# Patient Record
Sex: Male | Born: 1960 | Hispanic: No | Marital: Single | Smoking: Never smoker
Health system: Southern US, Community
[De-identification: ages and names within clinical notes are randomized; demographics above are authoritative.]

## PROBLEM LIST (undated history)

## (undated) DIAGNOSIS — E7800 Pure hypercholesterolemia, unspecified: Secondary | ICD-10-CM

## (undated) DIAGNOSIS — E785 Hyperlipidemia, unspecified: Secondary | ICD-10-CM

## (undated) DIAGNOSIS — I10 Essential (primary) hypertension: Secondary | ICD-10-CM

## (undated) HISTORY — PX: HERNIA REPAIR: SHX51

## (undated) HISTORY — DX: Essential (primary) hypertension: I10

## (undated) HISTORY — DX: Hyperlipidemia, unspecified: E78.5

---

## 2002-03-07 ENCOUNTER — Encounter: Payer: Self-pay | Admitting: Emergency Medicine

## 2002-03-07 ENCOUNTER — Emergency Department (HOSPITAL_COMMUNITY): Admission: EM | Admit: 2002-03-07 | Discharge: 2002-03-07 | Payer: Self-pay | Admitting: Emergency Medicine

## 2004-09-20 ENCOUNTER — Emergency Department (HOSPITAL_COMMUNITY): Admission: EM | Admit: 2004-09-20 | Discharge: 2004-09-20 | Payer: Self-pay | Admitting: Emergency Medicine

## 2011-05-05 ENCOUNTER — Other Ambulatory Visit: Payer: Self-pay | Admitting: Family Medicine

## 2011-05-05 DIAGNOSIS — I10 Essential (primary) hypertension: Secondary | ICD-10-CM

## 2011-05-05 DIAGNOSIS — E7800 Pure hypercholesterolemia, unspecified: Secondary | ICD-10-CM

## 2014-01-28 ENCOUNTER — Ambulatory Visit (INDEPENDENT_AMBULATORY_CARE_PROVIDER_SITE_OTHER): Payer: BC Managed Care – PPO | Admitting: Family Medicine

## 2014-01-28 VITALS — BP 180/104 | HR 88 | Temp 98.5°F | Resp 16 | Ht 64.5 in | Wt 158.6 lb

## 2014-01-28 DIAGNOSIS — N400 Enlarged prostate without lower urinary tract symptoms: Secondary | ICD-10-CM

## 2014-01-28 DIAGNOSIS — I10 Essential (primary) hypertension: Secondary | ICD-10-CM

## 2014-01-28 DIAGNOSIS — Z13220 Encounter for screening for lipoid disorders: Secondary | ICD-10-CM

## 2014-01-28 DIAGNOSIS — J069 Acute upper respiratory infection, unspecified: Secondary | ICD-10-CM

## 2014-01-28 DIAGNOSIS — Z1211 Encounter for screening for malignant neoplasm of colon: Secondary | ICD-10-CM

## 2014-01-28 DIAGNOSIS — R3911 Hesitancy of micturition: Secondary | ICD-10-CM

## 2014-01-28 DIAGNOSIS — K4090 Unilateral inguinal hernia, without obstruction or gangrene, not specified as recurrent: Secondary | ICD-10-CM

## 2014-01-28 DIAGNOSIS — Z131 Encounter for screening for diabetes mellitus: Secondary | ICD-10-CM

## 2014-01-28 DIAGNOSIS — Z0000 Encounter for general adult medical examination without abnormal findings: Secondary | ICD-10-CM

## 2014-01-28 DIAGNOSIS — Z1329 Encounter for screening for other suspected endocrine disorder: Secondary | ICD-10-CM

## 2014-01-28 DIAGNOSIS — Z130 Encounter for screening for diseases of the blood and blood-forming organs and certain disorders involving the immune mechanism: Secondary | ICD-10-CM

## 2014-01-28 DIAGNOSIS — I517 Cardiomegaly: Secondary | ICD-10-CM

## 2014-01-28 LAB — POCT UA - MICROSCOPIC ONLY
Bacteria, U Microscopic: NEGATIVE
Casts, Ur, LPF, POC: NEGATIVE
Crystals, Ur, HPF, POC: NEGATIVE
Mucus, UA: NEGATIVE
WBC, Ur, HPF, POC: NEGATIVE
Yeast, UA: NEGATIVE

## 2014-01-28 LAB — POCT CBC
Granulocyte percent: 78.8 %G (ref 37–80)
HCT, POC: 41.7 % — AB (ref 43.5–53.7)
Hemoglobin: 13.5 g/dL — AB (ref 14.1–18.1)
Lymph, poc: 2.1 (ref 0.6–3.4)
MCH, POC: 30.7 pg (ref 27–31.2)
MCHC: 32.5 g/dL (ref 31.8–35.4)
MCV: 94.5 fL (ref 80–97)
MID (cbc): 0.3 (ref 0–0.9)
MPV: 8.4 fL (ref 0–99.8)
POC Granulocyte: 9 — AB (ref 2–6.9)
POC LYMPH PERCENT: 18.2 %L (ref 10–50)
POC MID %: 3 %M (ref 0–12)
Platelet Count, POC: 196 10*3/uL (ref 142–424)
RBC: 4.41 M/uL — AB (ref 4.69–6.13)
RDW, POC: 13.3 %
WBC: 11.4 10*3/uL — AB (ref 4.6–10.2)

## 2014-01-28 LAB — POCT URINALYSIS DIPSTICK
Bilirubin, UA: NEGATIVE
Glucose, UA: NEGATIVE
Ketones, UA: NEGATIVE
Leukocytes, UA: NEGATIVE
Nitrite, UA: NEGATIVE
Protein, UA: NEGATIVE
Spec Grav, UA: 1.015
Urobilinogen, UA: 1
pH, UA: 6

## 2014-01-28 LAB — POC HEMOCCULT BLD/STL (OFFICE/1-CARD/DIAGNOSTIC): FECAL OCCULT BLD: NEGATIVE

## 2014-01-28 LAB — POCT GLYCOSYLATED HEMOGLOBIN (HGB A1C): Hemoglobin A1C: 5.1

## 2014-01-28 MED ORDER — TAMSULOSIN HCL 0.4 MG PO CAPS: 0.4000 mg | ORAL_CAPSULE | Freq: Every day | ORAL | Status: DC

## 2014-01-28 MED ORDER — LISINOPRIL-HYDROCHLOROTHIAZIDE 20-25 MG PO TABS: 1.0000 | ORAL_TABLET | Freq: Every day | ORAL | Status: DC

## 2014-01-28 NOTE — Patient Instructions (Signed)
Resume taking the blood pressure medicine one daily  Taking the prostate pill, tamsulosin, 1 at bedtime  Referral is being made to general surgery to evaluate your hernia  Referral will be made to gastroenterology to arrange for a colonoscopy for you which should be done on a screening basis for everyone over 50. You should get one every 10 years unless they find something of more concern.

## 2014-01-28 NOTE — Progress Notes (Signed)
Physical exam:  History: 53 year old gentleman who is here for a physical examination. He has had a recent cold. He has a history of high blood pressure but has not been on medication for a long time. His problem been about 7 or 8 years since he has had a physical examination done.  Past medical history: Medications: None Allergies: None Operations: None Hospitalizations: None Major illnesses: History of hypertension, currently untreated. History of hyperlipidemia.  Family history: Very poorly documented family history. He never knew his father, and it is been about 40 years since he saw his mother. She is now deceased.  Social history: [place], full blooded [place] history. 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 He is single, never married, no children. No longer attends church. Couldn't tolerate the contemporary services. He does not drive but he rides a bicycle everywhere so he gets a lot of exercise both doing his work and riding his bicycle. He does not drink, smoke, or use tobacco. 10th grade education  Review of systems: HEENT: Unremarkable. He did have some glasses but he sat on them and knows he needs them some time. Constitutional: Unremarkable Respiratory: Unremarkable Cardiovascular: Unremarkable Gastrointestinal: Unremarkable Genitourinary: He has urinary hesitancy and nocturia about 4 times. This been bothering him a lot over the last few months. Musculoskeletal: Unremarkable Neurologic: Unremarkable Psychiatric: Unremarkable Dermatologic: Unremarkable  Physical exam: Healthy-appearing gentleman in no acute distress. His TMs are normal. Eyes PERRLA. Fundi benign. Throat clear. Neck supple without significant nodes. No carotid bruits. Chest is clear to auscultation. Heart regular without murmurs gallops or arrhythmias. Abdomen soft without mass or tenderness. Has a moderate size left inguinal hernia which he did not know he had. No testicular  masses. Penis normal. Digital rectal exam reveals prostate gland to be a little on the firm side, upper normal in size, no nodules. Extremities unremarkable. Skin normal.  Assessment: Physical examination Hypertension, unsatisfactory control Left inguinal hernia Upper respiratory infection, stable Urinary hesitancy Nocturia Probable benign prostatic hypertrophy  Plan: Urinalysis, CBC, hemoglobin A1c, Hemoccult, cmet, TSH, lipids, PSA, EKG  Results for orders placed in visit on 01/28/14  POCT CBC      Result Value Ref Range   WBC 11.4 (*) 4.6 - 10.2 K/uL   Lymph, poc 2.1  0.6 - 3.4   POC LYMPH PERCENT 18.2  10 - 50 %L   MID (cbc) 0.3  0 - 0.9   POC MID % 3.0  0 - 12 %M   POC Granulocyte 9.0 (*) 2 - 6.9   Granulocyte percent 78.8  37 - 80 %G   RBC 4.41 (*) 4.69 - 6.13 M/uL   Hemoglobin 13.5 (*) 14.1 - 18.1 g/dL   HCT, POC 40.941.7 (*) 81.143.5 - 53.7 %   MCV 94.5  80 - 97 fL   MCH, POC 30.7  27 - 31.2 pg   MCHC 32.5  31.8 - 35.4 g/dL   RDW, POC 91.413.3     Platelet Count, POC 196  142 - 424 K/uL   MPV 8.4  0 - 99.8 fL  POCT GLYCOSYLATED HEMOGLOBIN (HGB A1C)      Result Value Ref Range   Hemoglobin A1C 5.1    POCT UA - MICROSCOPIC ONLY      Result Value Ref Range   WBC, Ur, HPF, POC neg     RBC, urine, microscopic 4-8     Bacteria, U Microscopic neg     Mucus, UA neg  Epithelial cells, urine per micros 0-1     Crystals, Ur, HPF, POC neg     Casts, Ur, LPF, POC neg     Yeast, UA neg    POCT URINALYSIS DIPSTICK      Result Value Ref Range   Color, UA yellow     Clarity, UA clear     Glucose, UA neg     Bilirubin, UA neg     Ketones, UA neg     Spec Grav, UA 1.015     Blood, UA moderate     pH, UA 6.0     Protein, UA neg     Urobilinogen, UA 1.0     Nitrite, UA neg     Leukocytes, UA Negative    POC HEMOCCULT BLD/STL (OFFICE/1-CARD/DIAGNOSTIC)      Result Value Ref Range   Card #1 Date 01-28-14     Fecal Occult Blood, POC Negative

## 2014-01-29 LAB — LIPID PANEL
Cholesterol: 180 mg/dL (ref 0–200)
HDL: 35 mg/dL — ABNORMAL LOW (ref >39)
LDL Cholesterol: 114 mg/dL — ABNORMAL HIGH (ref 0–99)
Total CHOL/HDL Ratio: 5.1 Ratio
Triglycerides: 157 mg/dL — ABNORMAL HIGH (ref <150)
VLDL: 31 mg/dL (ref 0–40)

## 2014-01-29 LAB — COMPREHENSIVE METABOLIC PANEL
ALT: 18 U/L (ref 0–53)
AST: 21 U/L (ref 0–37)
Albumin: 3.9 g/dL (ref 3.5–5.2)
Alkaline Phosphatase: 41 U/L (ref 39–117)
BUN: 13 mg/dL (ref 6–23)
CO2: 24 mEq/L (ref 19–32)
Calcium: 8.8 mg/dL (ref 8.4–10.5)
Chloride: 103 mEq/L (ref 96–112)
Creat: 0.89 mg/dL (ref 0.50–1.35)
Glucose, Bld: 87 mg/dL (ref 70–99)
Potassium: 4.1 mEq/L (ref 3.5–5.3)
Sodium: 136 mEq/L (ref 135–145)
Total Bilirubin: 0.4 mg/dL (ref 0.2–1.2)
Total Protein: 7.2 g/dL (ref 6.0–8.3)

## 2014-01-29 LAB — TSH: TSH: 1.447 u[IU]/mL (ref 0.350–4.500)

## 2014-01-30 LAB — PSA: PSA: 1.15 ng/mL (ref <=4.00)

## 2014-01-31 ENCOUNTER — Encounter: Payer: Self-pay | Admitting: Radiology

## 2014-02-01 ENCOUNTER — Encounter: Payer: Self-pay | Admitting: Internal Medicine

## 2014-04-03 ENCOUNTER — Telehealth: Payer: Self-pay | Admitting: *Deleted

## 2014-04-03 NOTE — Telephone Encounter (Signed)
Patient no show for previsit appointment 04/03/14. Left message to return call to reschedule previsit appointment before 5 pm today or colonoscopy scheduled 04/10/14 would be cancelled as well and both appointments would have to be rescheduled.

## 2014-04-03 NOTE — Telephone Encounter (Signed)
Multiple attempts to call patient throughout the day. Last attempt reached a gentleman who states he is not Mr. Ogan and does not know who that person is. He states he has had the phone number called approximately 1 month. Appointments cancelled and no show letter mailed to address on file.

## 2014-04-10 ENCOUNTER — Encounter: Payer: Self-pay | Admitting: Internal Medicine

## 2014-04-15 ENCOUNTER — Emergency Department (HOSPITAL_COMMUNITY)
Admission: EM | Admit: 2014-04-15 | Discharge: 2014-04-15 | Disposition: A | Discharge status: Home / Self Care | Payer: BC Managed Care – PPO | Attending: Emergency Medicine | Admitting: Emergency Medicine

## 2014-04-15 ENCOUNTER — Encounter (HOSPITAL_COMMUNITY): Payer: Self-pay | Admitting: Cardiology

## 2014-04-15 DIAGNOSIS — N3000 Acute cystitis without hematuria: Secondary | ICD-10-CM | POA: Insufficient documentation

## 2014-04-15 DIAGNOSIS — K4090 Unilateral inguinal hernia, without obstruction or gangrene, not specified as recurrent: Secondary | ICD-10-CM | POA: Diagnosis present

## 2014-04-15 DIAGNOSIS — E785 Hyperlipidemia, unspecified: Secondary | ICD-10-CM | POA: Insufficient documentation

## 2014-04-15 DIAGNOSIS — Z79899 Other long term (current) drug therapy: Secondary | ICD-10-CM | POA: Insufficient documentation

## 2014-04-15 DIAGNOSIS — I10 Essential (primary) hypertension: Secondary | ICD-10-CM | POA: Diagnosis not present

## 2014-04-15 LAB — URINALYSIS, ROUTINE W REFLEX MICROSCOPIC
Bilirubin Urine: NEGATIVE
Glucose, UA: NEGATIVE mg/dL
Hgb urine dipstick: NEGATIVE
Ketones, ur: NEGATIVE mg/dL
Nitrite: POSITIVE — AB
Protein, ur: NEGATIVE mg/dL
Specific Gravity, Urine: 1.01 (ref 1.005–1.030)
Urobilinogen, UA: 0.2 mg/dL (ref 0.0–1.0)
pH: 7.5 (ref 5.0–8.0)

## 2014-04-15 LAB — URINE MICROSCOPIC-ADD ON

## 2014-04-15 MED ORDER — NITROFURANTOIN MONOHYD MACRO 100 MG PO CAPS: 100.0000 mg | ORAL_CAPSULE | Freq: Two times a day (BID) | ORAL | Status: DC

## 2014-04-15 NOTE — Discharge Instructions (Signed)
Return here as needed.  Call the surgeon for an appointment.  Return here as needed

## 2014-04-15 NOTE — ED Notes (Signed)
Pt left prior to receiving dc paperwork

## 2014-04-15 NOTE — ED Provider Notes (Addendum)
CSN: [id number]     Arrival date & time 04/15/14  1007 History   First MD Initiated Contact with Patient 04/15/14 1110     Chief Complaint  Patient presents with  . Hernia     (Consider location/radiation/quality/duration/timing/severity/associated sxs/prior Treatment) HPI Patient presents to the emergency department with a left inguinal hernia.  The patient states that he was looking to have this assessed and possibly have surgery here today for this hernia.  The patient is not complaining of any pain and states that he does have surgery scheduled for sometime in March.  The patient denies chest pain, shortness of breath, weakness, dizziness, fever, abdominal pain, diarrhea, nausea, vomiting, or fever.  The patient states that he originally had since surgery scheduled for January 5. Past Medical History  Diagnosis Date  . Hyperlipidemia   . Hypertension    History reviewed. No pertinent past surgical history. History reviewed. No pertinent family history. History  Substance Use Topics  . Smoking status: Never Smoker   . Smokeless tobacco: Never Used  . Alcohol Use: No    Review of Systems  All other systems negative except as documented in the HPI. All pertinent positives and negatives as reviewed in the HPI.   Allergies  Review of patient's allergies indicates no known allergies.  Home Medications   Prior to Admission medications   Medication Sig Start Date End Date Taking? Authorizing Provider  lisinopril-hydrochlorothiazide (PRINZIDE,ZESTORETIC) 20-25 MG per tablet Take 1 tablet by mouth daily. 01/28/14  Yes Peyton Najjaravid H Hopper, MD  psyllium (REGULOID) 0.52 G capsule Take 1.04 g by mouth daily.   Yes Historical Provider, MD  pravastatin (PRAVACHOL) 40 MG tablet TAKE 1 TABLET EVERY DAY Patient not taking: Reported on 04/15/2014 05/05/11   Raymon Muttonyan M Dunn, PA-C  tamsulosin (FLOMAX) 0.4 MG CAPS capsule Take 1 capsule (0.4 mg total) by mouth daily. Patient not taking: Reported on  04/15/2014 01/28/14   Peyton Najjaravid H Hopper, MD   BP 105/73 mmHg  Pulse 91  Temp(Src) 97.9 F (36.6 C) (Oral)  Resp 18  SpO2 96% Physical Exam  Constitutional: He is oriented to person, place, and time. He appears well-developed and well-nourished. No distress.  HENT:  Head: Normocephalic and atraumatic.  Mouth/Throat: Oropharynx is clear and moist.  Eyes: Pupils are equal, round, and reactive to light.  Neck: Normal range of motion. Neck supple.  Cardiovascular: Normal rate, regular rhythm and normal heart sounds.  Exam reveals no gallop and no friction rub.   No murmur heard. Pulmonary/Chest: Effort normal and breath sounds normal. No respiratory distress.  Abdominal: Normal appearance. There is no tenderness. A hernia is present. Hernia confirmed positive in the left inguinal area.    Neurological: He is alert and oriented to person, place, and time. He exhibits normal muscle tone. Coordination normal.  Skin: Skin is warm and dry. No rash noted. No erythema.  Nursing note and vitals reviewed.   ED Course  Procedures (including critical care time)  MDM   I spoke with the general surgeon about the patient and he said that he can follow-up in their clinic as needed for a recheck, but the patient does have surgery scheduled for March the main issue has been procuring enough funds to pay for the upfront cost of surgery.  Patient does have some mild discomfort with urination      Carlyle DollyChristopher W Nayali Talerico, PA-C 04/15/14 1439  Nelia Shiobert L Beaton, MD 04/15/14 [phone]

## 2014-04-15 NOTE — ED Notes (Signed)
Pt reports that he thinks that he has a left inguinal hernia. Reports that he went to [facility] and needed surgery but did not have he money at the time. Pt reports that he has had some difficulty urinating.

## 2014-04-15 NOTE — ED Notes (Signed)
Pt walked out of ED room

## 2014-04-17 LAB — URINE CULTURE: Colony Count: 15000

## 2014-05-29 ENCOUNTER — Other Ambulatory Visit: Payer: Self-pay | Admitting: Family Medicine

## 2014-08-01 ENCOUNTER — Other Ambulatory Visit: Payer: Self-pay | Admitting: Physician Assistant

## 2014-09-29 ENCOUNTER — Ambulatory Visit (INDEPENDENT_AMBULATORY_CARE_PROVIDER_SITE_OTHER): Payer: BC Managed Care – PPO | Admitting: Family Medicine

## 2014-09-29 VITALS — BP 108/70 | HR 105 | Temp 98.3°F | Resp 20 | Ht 65.0 in | Wt 157.0 lb

## 2014-09-29 DIAGNOSIS — I10 Essential (primary) hypertension: Secondary | ICD-10-CM | POA: Diagnosis not present

## 2014-09-29 MED ORDER — LISINOPRIL-HYDROCHLOROTHIAZIDE 20-25 MG PO TABS: ORAL_TABLET | ORAL | Status: DC

## 2014-09-29 NOTE — Progress Notes (Signed)
  Subjective:  Patient ID: Gregory Booker, male    DOB: Sep 15, 1960  Age: 54 y.o. MRN: [id number]  Patient is here for a follow-up with regard to his blood pressure. He has been getting regular exercise. He feels good. He has no complaints. No headaches or dizziness. No chest pains or palpitations or shortness of breath. No GI or GU complaint plates. He did have his hernia repaired. He needed this appointment for his medication refill.   Objective:   No acute distress. Throat clear. Neck supple without nodes. No carotid bruits. Chest is clear process. Heart regular without murmurs. No ankle edema.     Assessment & Plan:   Assessment: Hypertension, good control History of inguinal herniorrhaphy  Plan: Return in about 6 months or so for his regular physical exam   Patient Instructions  Follow-up in about 6 months for your regular physical exam. Come in sooner if problems.  Continue the lisinopril HCT 20/25 one daily which is keeping her blood pressure in excellent control.     HOPPER,DAVID, MD 09/29/2014

## 2014-09-29 NOTE — Patient Instructions (Addendum)
Follow-up in about 6 months for your regular physical exam. Come in sooner if problems.  Continue the lisinopril HCT 20/25 one daily which is keeping her blood pressure in excellent control.

## 2015-01-26 ENCOUNTER — Telehealth: Payer: Self-pay

## 2015-04-19 ENCOUNTER — Other Ambulatory Visit: Payer: Self-pay | Admitting: Family Medicine

## 2015-05-17 NOTE — Telephone Encounter (Signed)
No msg °

## 2015-05-23 ENCOUNTER — Other Ambulatory Visit: Payer: Self-pay | Admitting: Family Medicine

## 2015-06-24 ENCOUNTER — Other Ambulatory Visit: Payer: Self-pay | Admitting: Family Medicine

## 2015-06-30 ENCOUNTER — Ambulatory Visit (INDEPENDENT_AMBULATORY_CARE_PROVIDER_SITE_OTHER): Payer: BC Managed Care – PPO | Admitting: Family Medicine

## 2015-06-30 VITALS — BP 102/70 | HR 82 | Temp 97.6°F | Resp 16 | Ht 65.5 in | Wt 156.0 lb

## 2015-06-30 DIAGNOSIS — E785 Hyperlipidemia, unspecified: Secondary | ICD-10-CM | POA: Diagnosis not present

## 2015-06-30 DIAGNOSIS — R3911 Hesitancy of micturition: Secondary | ICD-10-CM

## 2015-06-30 DIAGNOSIS — E7800 Pure hypercholesterolemia, unspecified: Secondary | ICD-10-CM | POA: Diagnosis not present

## 2015-06-30 DIAGNOSIS — N400 Enlarged prostate without lower urinary tract symptoms: Secondary | ICD-10-CM | POA: Diagnosis not present

## 2015-06-30 DIAGNOSIS — R351 Nocturia: Secondary | ICD-10-CM | POA: Diagnosis not present

## 2015-06-30 DIAGNOSIS — I10 Essential (primary) hypertension: Secondary | ICD-10-CM

## 2015-06-30 DIAGNOSIS — Z0000 Encounter for general adult medical examination without abnormal findings: Secondary | ICD-10-CM | POA: Diagnosis not present

## 2015-06-30 LAB — POCT URINALYSIS DIP (MANUAL ENTRY)
BILIRUBIN UA: NEGATIVE
Bilirubin, UA: NEGATIVE
Glucose, UA: NEGATIVE
Nitrite, UA: NEGATIVE
PH UA: 7.5
Protein Ur, POC: NEGATIVE
Spec Grav, UA: 1.015
Urobilinogen, UA: 0.2

## 2015-06-30 LAB — COMPLETE METABOLIC PANEL WITH GFR
ALBUMIN: 4.3 g/dL (ref 3.6–5.1)
ALT: 12 U/L (ref 9–46)
AST: 14 U/L (ref 10–35)
Alkaline Phosphatase: 37 U/L — ABNORMAL LOW (ref 40–115)
BUN: 10 mg/dL (ref 7–25)
CALCIUM: 9.4 mg/dL (ref 8.6–10.3)
CO2: 29 mmol/L (ref 20–31)
Chloride: 93 mmol/L — ABNORMAL LOW (ref 98–110)
Creat: 0.99 mg/dL (ref 0.70–1.33)
GFR, EST NON AFRICAN AMERICAN: 85 mL/min (ref >=60)
GFR, Est African American: >89 mL/min (ref >=60)
Glucose, Bld: 84 mg/dL (ref 65–99)
Potassium: 4.5 mmol/L (ref 3.5–5.3)
Sodium: 127 mmol/L — ABNORMAL LOW (ref 135–146)
Total Bilirubin: 0.6 mg/dL (ref 0.2–1.2)
Total Protein: 7.5 g/dL (ref 6.1–8.1)

## 2015-06-30 LAB — POCT CBC
Granulocyte percent: 71.3 %G (ref 37–80)
HCT, POC: 37.4 % — AB (ref 43.5–53.7)
HEMOGLOBIN: 13.6 g/dL — AB (ref 14.1–18.1)
Lymph, poc: 1.8 (ref 0.6–3.4)
MCH, POC: 33.1 pg — AB (ref 27–31.2)
MCHC: 36.3 g/dL — AB (ref 31.8–35.4)
MCV: 91.2 fL (ref 80–97)
MID (cbc): 0.6 (ref 0–0.9)
MPV: 7.4 fL (ref 0–99.8)
PLATELET COUNT, POC: 226 10*3/uL (ref 142–424)
POC Granulocyte: 6.1 (ref 2–6.9)
POC LYMPH %: 21.7 % (ref 10–50)
POC MID %: 7 %M (ref 0–12)
RBC: 4.1 M/uL — AB (ref 4.69–6.13)
RDW, POC: 11.7 %
WBC: 8.5 10*3/uL (ref 4.6–10.2)

## 2015-06-30 LAB — LIPID PANEL
CHOLESTEROL: 171 mg/dL (ref 125–200)
HDL: 36 mg/dL — ABNORMAL LOW (ref >=40)
LDL CALC: 116 mg/dL (ref <130)
Total CHOL/HDL Ratio: 4.8 Ratio (ref <=5.0)
Triglycerides: 93 mg/dL (ref <150)
VLDL: 19 mg/dL (ref <30)

## 2015-06-30 LAB — POC MICROSCOPIC URINALYSIS (UMFC)

## 2015-06-30 LAB — POC HEMOCCULT BLD/STL (OFFICE/1-CARD/DIAGNOSTIC): Fecal Occult Blood, POC: NEGATIVE

## 2015-06-30 LAB — POCT GLYCOSYLATED HEMOGLOBIN (HGB A1C): HEMOGLOBIN A1C: 5.4

## 2015-06-30 MED ORDER — LISINOPRIL-HYDROCHLOROTHIAZIDE 20-25 MG PO TABS: 1.0000 | ORAL_TABLET | Freq: Every day | ORAL | Status: DC

## 2015-06-30 MED ORDER — TAMSULOSIN HCL 0.4 MG PO CAPS: 0.4000 mg | ORAL_CAPSULE | Freq: Every day | ORAL | Status: DC

## 2015-06-30 MED ORDER — PRAVASTATIN SODIUM 40 MG PO TABS: 40.0000 mg | ORAL_TABLET | Freq: Every day | ORAL | Status: DC

## 2015-06-30 NOTE — Progress Notes (Signed)
Patient ID: Gregory Booker, male    DOB: Apr 18, 1960  Age: 55 y.o. MRN: [id number]  Prostate  Subjective:   Patient is very concerned about his prostate gland. He has a difficult time urinating. He says he has to lean his head against the wall and force it out. He has nocturia 2. Often at home he just has to sit on the toilet to be able to urinate.  Current allergies, medications, problem list, past/family and social histories reviewed.  Objective:  BP 102/70 mmHg  Pulse 82  Temp(Src) 97.6 F (36.4 C) (Oral)  Resp 16  Ht 5' 5.5" (1.664 m)  Wt 156 lb (70.761 kg)  BMI 25.56 kg/m2  SpO2 98%  Abdomen soft without mass or tenderness. Digital exam his prostate gland be moderate though not severely enlarged. It is fairly firm but no nodules.  Assessment & Plan:   Assessment: BPH symptoms   Plan: Check PSA. Consider trying tamsulosin again. Similar urology referral.  Orders Placed This Encounter  Procedures  . Lipid panel  . COMPLETE METABOLIC PANEL WITH GFR  . PSA  . POCT CBC  . POCT glycosylated hemoglobin (Hb A1C)  . POCT Microscopic Urinalysis ([facility]FC)  . POC Hemoccult Bld/Stl (1-Cd Office Dx)  . POCT urinalysis dipstick    No orders of the defined types were placed in this encounter.         Patient Instructions   I will let you know the results of your blood tests in a few days.  Continue your blood pressure medication daily  I would urge you to consider trying the medicine for your prostate, tamsulosin, 1 each evening again. If you still feel like he cannot tolerate it and it makes you go to often, we can make a referral to a urologist for you to evaluate your prostate further. I will let you know the results of the blood test of the prostate, and you might want to wait until you get that result before you consider starting the medicine  Also wait and to get your cholesterol results before you fill the prescription for pravastatin 1 daily for the  cholesterol.  Return in 6 months, sooner if problems  You asked about Dr. Shanda BumpsJessica Copland. She is [facility] at the [facility] on [address]        IF you received an x-ray today, you will receive an invoice from [facility]. Please contact [facility] at [phone] with questions or concerns regarding your invoice.   IF you received labwork today, you will receive an invoice from [facility]. Please contact [facility] at [phone] with questions or concerns regarding your invoice.   Our billing staff will not be able to assist you with questions regarding bills from these companies.  You will be contacted with the lab results as soon as they are available. The fastest way to get your results is to activate your My Chart account. Instructions are located on the last page of this paperwork. If you have not heard from us regarding the results in 2 weeks, please contact this office.          Return in about 6 months (around 12/30/2015).   Ave Scharnhorst, MD 06/30/2015

## 2015-06-30 NOTE — Patient Instructions (Addendum)
I will let you know the results of your blood tests in a few days.  Continue your blood pressure medication daily  I would urge you to consider trying the medicine for your prostate, tamsulosin, 1 each evening again. If you still feel like he cannot tolerate it and it makes you go to often, we can make a referral to a urologist for you to evaluate your prostate further. I will let you know the results of the blood test of the prostate, and you might want to wait until you get that result before you consider starting the medicine  Also wait and to get your cholesterol results before you fill the prescription for pravastatin 1 daily for the cholesterol.  Return in 6 months, sooner if problems  You asked about Dr. Shanda BumpsJessica Copland. She is [facility] at the [facility] on [address]        IF you received an x-ray today, you will receive an invoice from [facility]. Please contact [facility] at [phone] with questions or concerns regarding your invoice.   IF you received labwork today, you will receive an invoice from [facility]. Please contact [facility] at [phone] with questions or concerns regarding your invoice.   Our billing staff will not be able to assist you with questions regarding bills from these companies.  You will be contacted with the lab results as soon as they are available. The fastest way to get your results is to activate your My Chart account. Instructions are located on the last page of this paperwork. If you have not heard from us regarding the results in 2 weeks, please contact this office.

## 2015-06-30 NOTE — Progress Notes (Signed)
Patient ID: Gregory PalmsSamuel Galeas, male    DOB: 01-Aug-1960  Age: 55 y.o. MRN: [id number]  Chief Complaint  Patient presents with  . Annual Exam    Subjective:   History: 55 year old gentleman who is here for a physical examination.He wants to make certain his prostate is checked. He has to sit on the commode to fully empty his bladder at times.  Past medical history: Medications: Blood pressure Allergies: None Operations: Herniorrhaphy Hospitalizations: None Major illnesses: History of hypertension, currently untreated. History of hyperlipidemia.  Family history: Very poorly documented family history. He never knew his father, and it is been about 40 years since he saw his mother. She is now deceased.  Social history: He [place], full blooded [place] history. 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 No longer attends church. Couldn't tolerate the contemporary services. He does not drive but he rides a bicycle everywhere so he gets a lot of exercise both doing his work and riding his bicycle. He does not drink, smoke, or use tobacco. 10th grade education  Review of systems: HEENT: Unremarkable. He did have some glasses but he sat on them and knows he needs them some time. Constitutional: Unremarkable Respiratory: Unremarkable Cardiovascular: Unremarkable Gastrointestinal: Unremarkable Genitourinary: He has urinary hesitancy and nocturia  Musculoskeletal: Unremarkable Neurologic: Unremarkable Psychiatric: Unremarkable Dermatologic: Unremarkable  Current allergies, medications, problem list, past/family and social histories reviewed.  Objective:  BP 102/70 mmHg  Pulse 82  Temp(Src) 97.6 F (36.4 C) (Oral)  Resp 16  Ht 5' 5.5" (1.664 m)  Wt 156 lb (70.761 kg)  BMI 25.56 kg/m2  SpO2 98%  Physical exam: Healthy-appearing gentleman in no acute distress. His TMs are normal. Eyes PERRLA.Marland Kitchen. Throat clear. Neck supple  without significant nodes. No carotid bruits. Chest is clear to auscultation. Heart regular without murmurs gallops or arrhythmias. Abdomen soft without mass or tenderness. Has a moderate size left inguinal hernia which he did not know he had. No testicular masses. Penis normal. Digital rectal exam reveals prostate gland to be a little on the firm side, upper normal in size, no nodules. Extremities unremarkable. Skin normal. Pedal pulses good.  Assessment & Plan:   Assessment: 1. Annual physical exam   2. BPH (benign prostatic hyperplasia)   3. Urinary hesitancy   4. Nocturia   5. Hyperlipidemia   6. Pure hypercholesterolemia   7. Benign essential HTN       Plan: See separate discussion of prostate. Labs ordered. See instructions.  Orders Placed This Encounter  Procedures  . Lipid panel  . COMPLETE METABOLIC PANEL WITH GFR  . PSA  . POCT CBC  . POCT glycosylated hemoglobin (Hb A1C)  . POCT Microscopic Urinalysis ([facility])  . POC Hemoccult Bld/Stl (1-Cd Office Dx)  . POCT urinalysis dipstick    No orders of the defined types were placed in this encounter.   Results for orders placed or performed in visit on 06/30/15  POCT CBC  Result Value Ref Range   WBC 8.5 4.6 - 10.2 K/uL   Lymph, poc 1.8 0.6 - 3.4   POC LYMPH PERCENT 21.7 10 - 50 %L   MID (cbc) 0.6 0 - 0.9   POC MID % 7.0 0 - 12 %M   POC Granulocyte 6.1 2 - 6.9   Granulocyte percent 71.3 37 - 80 %G   RBC 4.10 (A) 4.69 - 6.13 M/uL   Hemoglobin 13.6 (A) 14.1 - 18.1 g/dL   HCT,  POC 37.4 (A) 43.5 - 53.7 %   MCV 91.2 80 - 97 fL   MCH, POC 33.1 (A) 27 - 31.2 pg   MCHC 36.3 (A) 31.8 - 35.4 g/dL   RDW, POC 16.1 %   Platelet Count, POC 226 142 - 424 K/uL   MPV 7.4 0 - 99.8 fL  POCT glycosylated hemoglobin (Hb A1C)  Result Value Ref Range   Hemoglobin A1C 5.4   POCT Microscopic Urinalysis ([facility])  Result Value Ref Range   WBC,UR,HPF,POC Few (A) None WBC/hpf   RBC,UR,HPF,POC Few (A) None RBC/hpf   Bacteria Few (A) None,  Too numerous to count   Mucus Present (A) Absent   Epithelial Cells, UR Per Microscopy Few (A) None, Too numerous to count cells/hpf  POC Hemoccult Bld/Stl (1-Cd Office Dx)  Result Value Ref Range   Card #1 Date 06/30/15    Fecal Occult Blood, POC Negative Negative  POCT urinalysis dipstick  Result Value Ref Range   Color, UA yellow yellow   Clarity, UA clear clear   Glucose, UA negative negative   Bilirubin, UA negative negative   Ketones, POC UA negative negative   Spec Grav, UA 1.015    Blood, UA trace-lysed (A) negative   pH, UA 7.5    Protein Ur, POC negative negative   Urobilinogen, UA 0.2    Nitrite, UA Negative Negative   Leukocytes, UA Trace (A) Negative         Patient Instructions   I will let you know the results of your blood tests in a few days.  Continue your blood pressure medication daily  I would urge you to consider trying the medicine for your prostate, tamsulosin, 1 each evening again. If you still feel like he cannot tolerate it and it makes you go to often, we can make a referral to a urologist for you to evaluate your prostate further. I will let you know the results of the blood test of the prostate, and you might want to wait until you get that result before you consider starting the medicine  Also wait and to get your cholesterol results before you fill the prescription for pravastatin 1 daily for the cholesterol.  Return in 6 months, sooner if problems  You asked about Dr. Shanda Bumps Copland. She is now at the [facility] on [address]        IF you received an x-ray today, you will receive an invoice from [facility]. Please contact [facility] at [phone] with questions or concerns regarding your invoice.   IF you received labwork today, you will receive an invoice from [facility]. Please contact [facility] at [phone] with questions or concerns regarding your invoice.   Our  billing staff will not be able to assist you with questions regarding bills from these companies.  You will be contacted with the lab results as soon as they are available. The fastest way to get your results is to activate your My Chart account. Instructions are located on the last page of this paperwork. If you have not heard from [place] regarding the results in 2 weeks, please contact this office.          Return in about 6 months (around 12/30/2015).   Catherene Kaleta, MD 06/30/2015

## 2015-07-02 LAB — PSA: PSA: 0.86 ng/mL (ref <=4.00)

## 2015-07-04 ENCOUNTER — Telehealth: Payer: Self-pay

## 2015-07-04 NOTE — Telephone Encounter (Signed)
Ok to get meds for cholesterol. Labs look normal.

## 2015-07-04 NOTE — Telephone Encounter (Signed)
Pt is wanting to know if he should go to the pharmacy to get cholesteral meds in regards to his labs done Saturday  Best number [phone]

## 2015-07-08 ENCOUNTER — Other Ambulatory Visit: Payer: Self-pay | Admitting: *Deleted

## 2015-07-08 ENCOUNTER — Other Ambulatory Visit: Payer: Self-pay | Admitting: Radiology

## 2015-07-08 DIAGNOSIS — I10 Essential (primary) hypertension: Secondary | ICD-10-CM

## 2015-07-08 DIAGNOSIS — N400 Enlarged prostate without lower urinary tract symptoms: Secondary | ICD-10-CM

## 2015-07-08 DIAGNOSIS — E7800 Pure hypercholesterolemia, unspecified: Secondary | ICD-10-CM

## 2015-07-08 MED ORDER — TAMSULOSIN HCL 0.4 MG PO CAPS: 0.4000 mg | ORAL_CAPSULE | Freq: Every day | ORAL | Status: DC

## 2015-07-08 MED ORDER — PRAVASTATIN SODIUM 40 MG PO TABS: 40.0000 mg | ORAL_TABLET | Freq: Every day | ORAL | Status: DC

## 2015-07-08 MED ORDER — LISINOPRIL-HYDROCHLOROTHIAZIDE 20-12.5 MG PO TABS: 1.0000 | ORAL_TABLET | Freq: Every day | ORAL | Status: DC

## 2015-07-08 NOTE — Telephone Encounter (Signed)
Patient was given lab results.  New medication of lisinopril was placed and pt was advised to start taking medication on tomorrow morning.  Pt understood.

## 2015-07-08 NOTE — Telephone Encounter (Signed)
noted 

## 2015-07-09 ENCOUNTER — Telehealth: Payer: Self-pay | Admitting: Family Medicine

## 2015-07-09 NOTE — Telephone Encounter (Signed)
Pt calling for lab result. Informed pt of labs and advised to come in for f/u labs in two weeks. He understood. He will come to walk-in.

## 2015-07-22 ENCOUNTER — Encounter: Payer: Self-pay | Admitting: *Deleted

## 2015-07-30 ENCOUNTER — Telehealth: Payer: Self-pay

## 2015-07-30 NOTE — Telephone Encounter (Signed)
Notes Recorded by Peyton Najjaravid H Hopper, MD on 07/03/2015 at 8:14 AM Call: His labs are good except for the sodium level is low. I think we need to make a change in his blood pressure medicine. He is on lisinopril/HCT 20/25. We need to decrease the HCTZ content. Please have him discontinue the previous, and call in for him lisinopril/ HCT 20/12.5 #30 with 3 refills. Schedule him for a lab only visit to recheck the BMP in 2 weeks. Tell him to eat a little bit more salt in his diet if he has been cutting back on it. If he has not been cutting back on it, just continues regular diet. Come in sooner if problems with headaches or dizziness.

## 2015-07-30 NOTE — Telephone Encounter (Signed)
Pt would like to speak with someone regarding his medication. States he had a month's supply and was told to cut in half and he didn't understand why. Please call [phone]

## 2015-07-31 NOTE — Telephone Encounter (Signed)
Spoke with pt, advised message from Dr. Alwyn RenHopper to discontinue the 20/25 and start 20/12.5. Pt understood.

## 2015-10-04 ENCOUNTER — Other Ambulatory Visit: Payer: Self-pay

## 2015-10-04 DIAGNOSIS — N400 Enlarged prostate without lower urinary tract symptoms: Secondary | ICD-10-CM

## 2015-10-04 MED ORDER — TAMSULOSIN HCL 0.4 MG PO CAPS: 0.4000 mg | ORAL_CAPSULE | Freq: Every day | ORAL | Status: DC

## 2015-10-04 NOTE — Telephone Encounter (Signed)
pt requesting 90 day supply - -0- refills  To return in Oct/2017

## 2016-06-17 ENCOUNTER — Other Ambulatory Visit: Payer: Self-pay | Admitting: Family Medicine

## 2016-06-17 DIAGNOSIS — E7800 Pure hypercholesterolemia, unspecified: Secondary | ICD-10-CM

## 2016-06-17 DIAGNOSIS — I10 Essential (primary) hypertension: Secondary | ICD-10-CM

## 2016-07-14 ENCOUNTER — Other Ambulatory Visit: Payer: Self-pay | Admitting: Physician Assistant

## 2016-07-14 DIAGNOSIS — E7800 Pure hypercholesterolemia, unspecified: Secondary | ICD-10-CM

## 2016-07-14 DIAGNOSIS — I10 Essential (primary) hypertension: Secondary | ICD-10-CM

## 2016-08-11 ENCOUNTER — Ambulatory Visit (INDEPENDENT_AMBULATORY_CARE_PROVIDER_SITE_OTHER): Payer: BC Managed Care – PPO | Admitting: Family Medicine

## 2016-08-11 ENCOUNTER — Encounter: Payer: Self-pay | Admitting: Family Medicine

## 2016-08-11 VITALS — BP 100/64 | HR 83 | Temp 97.8°F | Resp 16 | Ht 64.5 in | Wt 156.4 lb

## 2016-08-11 DIAGNOSIS — R3911 Hesitancy of micturition: Secondary | ICD-10-CM

## 2016-08-11 DIAGNOSIS — Z23 Encounter for immunization: Secondary | ICD-10-CM | POA: Diagnosis not present

## 2016-08-11 DIAGNOSIS — Z1211 Encounter for screening for malignant neoplasm of colon: Secondary | ICD-10-CM

## 2016-08-11 DIAGNOSIS — Z0000 Encounter for general adult medical examination without abnormal findings: Secondary | ICD-10-CM | POA: Diagnosis not present

## 2016-08-11 DIAGNOSIS — E785 Hyperlipidemia, unspecified: Secondary | ICD-10-CM

## 2016-08-11 DIAGNOSIS — Z114 Encounter for screening for human immunodeficiency virus [HIV]: Secondary | ICD-10-CM | POA: Diagnosis not present

## 2016-08-11 DIAGNOSIS — E871 Hypo-osmolality and hyponatremia: Secondary | ICD-10-CM

## 2016-08-11 DIAGNOSIS — Z1159 Encounter for screening for other viral diseases: Secondary | ICD-10-CM

## 2016-08-11 DIAGNOSIS — N400 Enlarged prostate without lower urinary tract symptoms: Secondary | ICD-10-CM | POA: Diagnosis not present

## 2016-08-11 DIAGNOSIS — E7800 Pure hypercholesterolemia, unspecified: Secondary | ICD-10-CM | POA: Diagnosis not present

## 2016-08-11 DIAGNOSIS — R059 Cough, unspecified: Secondary | ICD-10-CM

## 2016-08-11 DIAGNOSIS — R05 Cough: Secondary | ICD-10-CM | POA: Diagnosis not present

## 2016-08-11 DIAGNOSIS — I10 Essential (primary) hypertension: Secondary | ICD-10-CM | POA: Diagnosis not present

## 2016-08-11 MED ORDER — PRAVASTATIN SODIUM 40 MG PO TABS: 40.0000 mg | ORAL_TABLET | Freq: Every day | ORAL | 1 refills | Status: DC

## 2016-08-11 MED ORDER — LISINOPRIL-HYDROCHLOROTHIAZIDE 20-12.5 MG PO TABS: 1.0000 | ORAL_TABLET | Freq: Every day | ORAL | 1 refills | Status: DC

## 2016-08-11 NOTE — Patient Instructions (Addendum)
I will refer you to gastroenterology for colonoscopy, and to urology to discuss the difficulty with urination. I will also check your PSA blood test. No change in medications for now, I will recheck the sodium level.    Cough may be due to allergies, can try over-the-counter Flonase to see if that helps. If cough does not continue to improve in the next 2 weeks, or any worsening sooner, return for recheck.  Return to the clinic or go to the nearest emergency room if any of your symptoms worsen or new symptoms occur.  Keeping you healthy  Get these tests  Blood pressure- Have your blood pressure checked once a year by your healthcare provider.  Normal blood pressure is 120/80  Weight- Have your body mass index (BMI) calculated to screen for obesity.  BMI is a measure of body fat based on height and weight. You can also calculate your own BMI at [email or web address]  Cholesterol- Have your cholesterol checked every year.  Diabetes- Have your blood sugar checked regularly if you have high blood pressure, high cholesterol, have a family history of diabetes or if you are overweight.  Screening for Colon Cancer- Colonoscopy starting at age 58.  Screening may begin sooner depending on your family history and other health conditions. Follow up colonoscopy as directed by your Gastroenterologist.  Screening for Prostate Cancer- Both blood work (PSA) and a rectal exam help screen for Prostate Cancer.  Screening begins at age 58 with African-American men and at age 58 with Caucasian men.  Screening may begin sooner depending on your family history.  Take these medicines  Aspirin- One aspirin daily can help prevent Heart disease and Stroke.  Flu shot- Every fall.  Tetanus- Every 10 years.  Zostavax- Once after the age of 360 to prevent Shingles.  Pneumonia shot- Once after the age of 58; if you are younger than 1665, ask your healthcare provider if you need a Pneumonia shot.  Take these  steps  Don't smoke- If you do smoke, talk to your doctor about quitting.  For tips on how to quit, go to www.smokefree.gov or call 1-800-QUIT-NOW.  Be physically active- Exercise 5 days a week for at least 30 minutes.  If you are not already physically active start slow and gradually work up to 30 minutes of moderate physical activity.  Examples of moderate activity include walking briskly, mowing the yard, dancing, swimming, bicycling, etc.  Eat a healthy diet- Eat a variety of healthy food such as fruits, vegetables, low fat milk, low fat cheese, yogurt, lean meant, poultry, fish, beans, tofu, etc. For more information go to www.thenutritionsource.org  Drink alcohol in moderation- Limit alcohol intake to less than two drinks a day. Never drink and drive.  Dentist- Brush and floss twice daily; visit your dentist twice a year.  Depression- Your emotional health is as important as your physical health. If you're feeling down, or losing interest in things you would normally enjoy please talk to your healthcare provider.  Eye exam- Visit your eye doctor every year.  Safe sex- If you may be exposed to a sexually transmitted infection, use a condom.  Seat belts- Seat belts can save your life; always wear one.  Smoke/Carbon Monoxide detectors- These detectors need to be installed on the appropriate level of your home.  Replace batteries at least once a year.  Skin cancer- When out in the sun, cover up and use sunscreen 15 SPF or higher.  Violence- If anyone is threatening you, please tell  your healthcare provider.  Living Will/ Health care power of attorney- Speak with your healthcare provider and family.   IF you received an x-ray today, you will receive an invoice from [facility]. Please contact [facility] at [phone] with questions or concerns regarding your invoice.   IF you received labwork today, you will receive an invoice from [facility]. Please contact [facility]  at [phone] with questions or concerns regarding your invoice.   Our billing staff will not be able to assist you with questions regarding bills from these companies.  You will be contacted with the lab results as soon as they are available. The fastest way to get your results is to activate your My Chart account. Instructions are located on the last page of this paperwork. If you have not heard from [place] regarding the results in 2 weeks, please contact this office.

## 2016-08-11 NOTE — Progress Notes (Signed)
   Subjective:    Patient ID: Gregory Booker, male    DOB: 13-Nov-1960, 56 y.o.   MRN: [id number]  HPI    Review of Systems  Constitutional: Negative.   HENT: Negative.   Eyes: Negative.   Respiratory: Negative.   Cardiovascular: Negative.   Gastrointestinal: Negative.   Endocrine: Negative.   Genitourinary: Negative.   Musculoskeletal: Negative.   Skin: Negative.   Allergic/Immunologic: Negative.   Neurological: Negative.   Hematological: Negative.   Psychiatric/Behavioral: Negative.        Objective:   Physical Exam        Assessment & Plan:

## 2016-08-11 NOTE — Progress Notes (Signed)
By signing my name below, I, Mesha Guinyard, attest that this documentation has been prepared under the direction and in the presence of Meredith Staggers, MD.  Electronically Signed: Arvilla Market, Medical Scribe. 08/11/16. 8:25 AM.  Subjective:    Patient ID: Gregory Booker, male    DOB: 1960-08-26, 56 y.o.   MRN: [id number]  HPI Chief Complaint  Patient presents with  . Annual Exam    HPI Comments: Gregory Booker is a 56 y.o. male with a PMHx of HTN, HLD, and BPH who presents to Primary Care at [facility] for his annual physical. Last physical with Dr. Alwyn Ren 06/2015.  Cough: Reports remaining intermittent cough after cold 2-3 weeks ago. Pt drank Christiane Ha for relief of his sxs. He has allergies but he doesn't take anything for his sxs. Denies addiction to alcohol or drinking alcohol through the week. Denies fever, SOB, or trouble breathing.  HTN: Takes lisinopril-HCTZ 20/12.5 mg QD. Pt is complaint with lisinopril-HCTZ. Denies experiencing light-headedness, dizziness, or other negative side effects from his medication. Lab Results  Component Value Date   CREATININE 0.99 06/30/2015   BP Readings from Last 3 Encounters:  08/11/16 100/64  06/30/15 102/70  09/29/14 108/70   HLD: Takes pravastatin 40 mg QD. Pt is complaint with pravastatin. Denies experiencing negative side effects from his medication. Lab Results  Component Value Date   CHOL 171 06/30/2015   HDL 36 (L) 06/30/2015   LDLCALC 116 06/30/2015   TRIG 93 06/30/2015   CHOLHDL 4.8 06/30/2015   Lab Results  Component Value Date   ALT 12 06/30/2015   AST 14 06/30/2015   ALKPHOS 37 (L) 06/30/2015   BILITOT 0.6 06/30/2015   BPH: Has been treated with flomax 0.4 mg QD in the past, not recently. Repots intermittent slow urination so he'll sit down while using the commode, onset 1.5 years. Pt would put his hand in cold water or he'll wait until he "really has to go" to use the bathroom. Denies difficulty starting a  stream.  Lab Results  Component Value Date   PSA 0.86 06/30/2015   PSA 1.15 01/28/2014   Hyponatremia: Sodium of 127 at 06/2015 visit. Decreased the HCTZ from 25 to 12.5 mg. Planned to recheck BMP in 3 weeks. Has not been performed.  Cancer Screening: Prostate: Pt agrees to DRE and blood work for CA screening. Pt has not seen a urologist.  Lab Results  Component Value Date   PSA 0.86 06/30/2015   PSA 1.15 01/28/2014  Colon: Pt has never had a colonoscopy, and agrees to referral.   Immunizations: Pt isn't sure if it's been over 10 years since his last tdap.  There is no immunization history on file for this patient.  Hep C/ HIV Screening: Has not been performed. Pt agrees to Hep C screening and HIV screening. Reports low risk life style and sexual activity.  Vision: Pt is not followed by an ophthalmologist.  Visual Acuity Screening   Right eye Left eye Both eyes  Without correction:     With correction: 20/40 20/40 20/30    Dentist: Pt is not followed by a dentist.  Exercise: Pt rides his bike and does push ups every night.  Depression Screening: Depression screen [facility] 2/9 08/11/2016 06/30/2015 09/29/2014  Decreased Interest 0 0 0  Down, Depressed, Hopeless 0 0 0  PHQ - 2 Score 0 0 0   Patient Active Problem List   Diagnosis Date Noted  . Hyperlipidemia 06/30/2015  . HTN (hypertension) 09/29/2014  Past Medical History:  Diagnosis Date  . Hyperlipidemia   . Hypertension    Past Surgical History:  Procedure Laterality Date  . HERNIA REPAIR     No Known Allergies Prior to Admission medications   Medication Sig Start Date End Date Taking? Authorizing Provider  lisinopril-hydrochlorothiazide (PRINZIDE,ZESTORETIC) 20-12.5 MG tablet TAKE 1 TABLET BY MOUTH DAILY. 07/15/16   Sherren Mocha, MD  nitrofurantoin, macrocrystal-monohydrate, (MACROBID) 100 MG capsule Take 1 capsule (100 mg total) by mouth 2 (two) times daily. Patient not taking: Reported on 06/30/2015 04/15/14   Charlestine Night, PA-C  pravastatin (PRAVACHOL) 40 MG tablet TAKE 1 TABLET BY MOUTH DAILY. 07/15/16   Sherren Mocha, MD  psyllium (REGULOID) 0.52 G capsule Take 1.04 g by mouth daily. Reported on 06/30/2015    [provider]  tamsulosin (FLOMAX) 0.4 MG CAPS capsule Take 1 capsule (0.4 mg total) by mouth daily after supper. 10/04/15   Ethelda Chick, MD   Social History   Social History  . Marital status: Single    Spouse name: N/A  . Number of children: N/A  . Years of education: N/A   Occupational History  . Not on file.   Social History Main Topics  . Smoking status: Never Smoker  . Smokeless tobacco: Never Used  . Alcohol use No  . Drug use: No  . Sexual activity: Not on file   Other Topics Concern  . Not on file   Social History Narrative  . No narrative on file   Review of Systems  13 point ROS was negative. Objective:  Physical Exam  Constitutional: He is oriented to person, place, and time. He appears well-developed and well-nourished.  HENT:  Head: Normocephalic and atraumatic.  Right Ear: External ear normal.  Left Ear: External ear normal.  Mouth/Throat: Oropharynx is clear and moist.  Eyes: Conjunctivae and EOM are normal. Pupils are equal, round, and reactive to light.  Neck: Normal range of motion. Neck supple. No thyromegaly present.  Cardiovascular: Normal rate, regular rhythm, normal heart sounds and intact distal pulses.   Pulmonary/Chest: Effort normal and breath sounds normal. No respiratory distress. He has no wheezes.  Abdominal: Soft. He exhibits no distension. There is no tenderness. Hernia confirmed negative in the right inguinal area and confirmed negative in the left inguinal area.  Genitourinary: Prostate is enlarged. Prostate is not tender.  Musculoskeletal: Normal range of motion. He exhibits no edema or tenderness.  Lymphadenopathy:    He has no cervical adenopathy.  Neurological: He is alert and oriented to person, place, and time. He has  normal reflexes.  Skin: Skin is warm and dry.  Psychiatric: He has a normal mood and affect. His behavior is normal.  Vitals reviewed.  Vitals:   08/11/16 0815  BP: 100/64  Pulse: 83  Resp: 16  Temp: 97.8 F (36.6 C)  TempSrc: Oral  SpO2: 98%  Weight: 156 lb 6.4 oz (70.9 kg)  Height: 5' 4.5" (1.638 m)   Body mass index is 26.43 kg/m. Assessment & Plan:   Gregory Booker is a 56 y.o. male Annual physical exam  - -anticipatory guidance as below in AVS, screening labs above. Health maintenance items as above in HPI discussed/recommended as applicable.   Cough  - Post viral/allergic possible. Lungs clear. Vitals stable. Over-the-counter Flonase if needed, RTC precautions.  Essential hypertension, benign essential HTN - Plan: lisinopril-hydrochlorothiazide (PRINZIDE,ZESTORETIC) 20-12.5 MG tablet  - Controlled, will check labs on lower dose of HCTZ.  Hyperlipidemia, unspecified  hyperlipidemia type - Plan: Lipid panel  - Plan: pravastatin (PRAVACHOL) 40 MG tablet  -Check lipid panel, continue pravastatin same dose.  Hyponatremia - Plan: Comprehensive metabolic panel  - Repeat CMP.  Screening for HIV (human immunodeficiency virus) - Plan: HIV antibody  Encounter for hepatitis C screening test for low risk patient - Plan: Hepatitis C antibody  Benign prostatic hyperplasia, unspecified whether lower urinary tract symptoms present - Plan: PSA, Ambulatory referral to Urology Hesitancy - Plan: Ambulatory referral to Urology  - Possible slight enlargement on exam. Refer to urology with hesitancy symptoms. Check PSA  Need for Tdap vaccination - Plan: Tdap vaccine greater than or equal to 7yo IM  Screen for colon cancer - Plan: Ambulatory referral to Gastroenterology   Meds ordered this encounter  Medications  . lisinopril-hydrochlorothiazide (PRINZIDE,ZESTORETIC) 20-12.5 MG tablet    Sig: Take 1 tablet by mouth daily.    Dispense:  90 tablet    Refill:  1  . pravastatin  (PRAVACHOL) 40 MG tablet    Sig: Take 1 tablet (40 mg total) by mouth daily.    Dispense:  90 tablet    Refill:  1   Patient Instructions    I will refer you to gastroenterology for colonoscopy, and to urology to discuss the difficulty with urination. I will also check your PSA blood test. No change in medications for now, I will recheck the sodium level.    Cough may be due to allergies, can try over-the-counter Flonase to see if that helps. If cough does not continue to improve in the next 2 weeks, or any worsening sooner, return for recheck.  Return to the clinic or go to the nearest emergency room if any of your symptoms worsen or new symptoms occur.  Keeping you healthy  Get these tests  Blood pressure- Have your blood pressure checked once a year by your healthcare provider.  Normal blood pressure is 120/80  Weight- Have your body mass index (BMI) calculated to screen for obesity.  BMI is a measure of body fat based on height and weight. You can also calculate your own BMI at [email or web address]/.  Cholesterol- Have your cholesterol checked every year.  Diabetes- Have your blood sugar checked regularly if you have high blood pressure, high cholesterol, have a family history of diabetes or if you are overweight.  Screening for Colon Cancer- Colonoscopy starting at age 58.  Screening may begin sooner depending on your family history and other health conditions. Follow up colonoscopy as directed by your Gastroenterologist.  Screening for Prostate Cancer- Both blood work (PSA) and a rectal exam help screen for Prostate Cancer.  Screening begins at age 58 with African-American men and at age 58 with Caucasian men.  Screening may begin sooner depending on your family history.  Take these medicines  Aspirin- One aspirin daily can help prevent Heart disease and Stroke.  Flu shot- Every fall.  Tetanus- Every 10 years.  Zostavax- Once after the age of 58 to prevent  Shingles.  Pneumonia shot- Once after the age of 58; if you are younger than 6465, ask your healthcare provider if you need a Pneumonia shot.  Take these steps  Don't smoke- If you do smoke, talk to your doctor about quitting.  For tips on how to quit, go to www.smokefree.gov or call 1-800-QUIT-NOW.  Be physically active- Exercise 5 days a week for at least 30 minutes.  If you are not already physically active start slow and gradually work up to 30  minutes of moderate physical activity.  Examples of moderate activity include walking briskly, mowing the yard, dancing, swimming, bicycling, etc.  Eat a healthy diet- Eat a variety of healthy food such as fruits, vegetables, low fat milk, low fat cheese, yogurt, lean meant, poultry, fish, beans, tofu, etc. For more information go to www.thenutritionsource.org  Drink alcohol in moderation- Limit alcohol intake to less than two drinks a day. Never drink and drive.  Dentist- Brush and floss twice daily; visit your dentist twice a year.  Depression- Your emotional health is as important as your physical health. If you're feeling down, or losing interest in things you would normally enjoy please talk to your healthcare provider.  Eye exam- Visit your eye doctor every year.  Safe sex- If you may be exposed to a sexually transmitted infection, use a condom.  Seat belts- Seat belts can save your life; always wear one.  Smoke/Carbon Monoxide detectors- These detectors need to be installed on the appropriate level of your home.  Replace batteries at least once a year.  Skin cancer- When out in the sun, cover up and use sunscreen 15 SPF or higher.  Violence- If anyone is threatening you, please tell your healthcare provider.  Living Will/ Health care power of attorney- Speak with your healthcare provider and family.   IF you received an x-ray today, you will receive an invoice from [facility]. Please contact [facility] at  [phone] with questions or concerns regarding your invoice.   IF you received labwork today, you will receive an invoice from [facility]. Please contact [facility] at [phone] with questions or concerns regarding your invoice.   Our billing staff will not be able to assist you with questions regarding bills from these companies.  You will be contacted with the lab results as soon as they are available. The fastest way to get your results is to activate your My Chart account. Instructions are located on the last page of this paperwork. If you have not heard from [place] regarding the results in 2 weeks, please contact this office.      I personally performed the services described in this documentation, which was scribed in my presence. The recorded information has been reviewed and considered for accuracy and completeness, addended by me as needed, and agree with information above.  Signed,   Meredith Staggers, MD Primary Care at [facility].  08/11/16 8:47 AM

## 2016-08-12 LAB — LIPID PANEL
Chol/HDL Ratio: 4.1 ratio (ref 0.0–5.0)
Cholesterol, Total: 143 mg/dL (ref 100–199)
HDL: 35 mg/dL — ABNORMAL LOW (ref >39)
LDL Calculated: 82 mg/dL (ref 0–99)
Triglycerides: 129 mg/dL (ref 0–149)
VLDL Cholesterol Cal: 26 mg/dL (ref 5–40)

## 2016-08-12 LAB — COMPREHENSIVE METABOLIC PANEL
A/G RATIO: 1.4 (ref 1.2–2.2)
ALT: 11 IU/L (ref 0–44)
AST: 12 IU/L (ref 0–40)
Albumin: 4.2 g/dL (ref 3.5–5.5)
Alkaline Phosphatase: 39 IU/L (ref 39–117)
BILIRUBIN TOTAL: 0.5 mg/dL (ref 0.0–1.2)
BUN / CREAT RATIO: 11 (ref 9–20)
BUN: 11 mg/dL (ref 6–24)
CHLORIDE: 93 mmol/L — AB (ref 96–106)
CO2: 24 mmol/L (ref 18–29)
Calcium: 9.4 mg/dL (ref 8.7–10.2)
Creatinine, Ser: 0.98 mg/dL (ref 0.76–1.27)
GFR calc non Af Amer: 85 mL/min/{1.73_m2} (ref >59)
GFR, EST AFRICAN AMERICAN: 99 mL/min/{1.73_m2} (ref >59)
GLOBULIN, TOTAL: 3 g/dL (ref 1.5–4.5)
Glucose: 93 mg/dL (ref 65–99)
Potassium: 4.3 mmol/L (ref 3.5–5.2)
SODIUM: 136 mmol/L (ref 134–144)
Total Protein: 7.2 g/dL (ref 6.0–8.5)

## 2016-08-12 LAB — PSA: PROSTATE SPECIFIC AG, SERUM: 1.1 ng/mL (ref 0.0–4.0)

## 2016-08-12 LAB — HIV ANTIBODY (ROUTINE TESTING W REFLEX): HIV Screen 4th Generation wRfx: NONREACTIVE

## 2016-08-12 LAB — HEPATITIS C ANTIBODY: Hep C Virus Ab: <0.1 s/co ratio (ref 0.0–0.9)

## 2016-08-13 ENCOUNTER — Telehealth: Payer: Self-pay | Admitting: Family Medicine

## 2016-08-13 ENCOUNTER — Encounter: Payer: Self-pay | Admitting: Internal Medicine

## 2016-08-13 NOTE — Telephone Encounter (Signed)
PATIENT HAD HIS ANNUAL PHYSICAL DONE WITH DR. Neva SeatGREENE ON Monday (08/11/16). HE WOULD LIKE TO GET HIS LAB RESULTS ESPECIALLY HIS CHOLESTEROL. BEST PHONE [phone] (CELL) PHARMACY CHOICE IS [facility] ON [address] MBC

## 2016-08-14 ENCOUNTER — Other Ambulatory Visit: Payer: Self-pay | Admitting: Family Medicine

## 2016-08-14 DIAGNOSIS — I10 Essential (primary) hypertension: Secondary | ICD-10-CM

## 2016-08-14 DIAGNOSIS — E7800 Pure hypercholesterolemia, unspecified: Secondary | ICD-10-CM

## 2016-08-14 NOTE — Telephone Encounter (Signed)
Essentially normal correct?

## 2016-10-14 ENCOUNTER — Encounter: Payer: Self-pay | Admitting: Internal Medicine

## 2017-07-26 ENCOUNTER — Other Ambulatory Visit: Payer: Self-pay | Admitting: Family Medicine

## 2017-07-26 DIAGNOSIS — I10 Essential (primary) hypertension: Secondary | ICD-10-CM

## 2017-07-26 DIAGNOSIS — E7800 Pure hypercholesterolemia, unspecified: Secondary | ICD-10-CM

## 2017-08-13 ENCOUNTER — Encounter: Payer: BC Managed Care – PPO | Admitting: Family Medicine

## 2017-10-27 ENCOUNTER — Other Ambulatory Visit: Payer: Self-pay | Admitting: Family Medicine

## 2017-10-27 DIAGNOSIS — I10 Essential (primary) hypertension: Secondary | ICD-10-CM

## 2017-10-27 DIAGNOSIS — E7800 Pure hypercholesterolemia, unspecified: Secondary | ICD-10-CM

## 2017-10-28 NOTE — Telephone Encounter (Signed)
Refills for pravastatin sodium 40 mg tab   Prescription expired on 08/11/17 And lisinopril-hctz 20-12.5 mg tab   Prescription expired on 08/11/17  Dr. Neva SeatGreene  LOV  08/11/16

## 2017-11-12 ENCOUNTER — Ambulatory Visit (INDEPENDENT_AMBULATORY_CARE_PROVIDER_SITE_OTHER): Payer: BC Managed Care – PPO | Admitting: Family Medicine

## 2017-11-12 ENCOUNTER — Encounter: Payer: Self-pay | Admitting: Family Medicine

## 2017-11-12 ENCOUNTER — Other Ambulatory Visit: Payer: Self-pay

## 2017-11-12 VITALS — BP 102/66 | HR 83 | Temp 98.7°F | Ht 65.0 in | Wt 158.8 lb

## 2017-11-12 DIAGNOSIS — Z1211 Encounter for screening for malignant neoplasm of colon: Secondary | ICD-10-CM

## 2017-11-12 DIAGNOSIS — Z0000 Encounter for general adult medical examination without abnormal findings: Secondary | ICD-10-CM

## 2017-11-12 DIAGNOSIS — Z125 Encounter for screening for malignant neoplasm of prostate: Secondary | ICD-10-CM | POA: Diagnosis not present

## 2017-11-12 DIAGNOSIS — I10 Essential (primary) hypertension: Secondary | ICD-10-CM

## 2017-11-12 DIAGNOSIS — E7800 Pure hypercholesterolemia, unspecified: Secondary | ICD-10-CM

## 2017-11-12 MED ORDER — PRAVASTATIN SODIUM 40 MG PO TABS: 40.0000 mg | ORAL_TABLET | Freq: Every day | ORAL | 1 refills | Status: DC

## 2017-11-12 MED ORDER — LISINOPRIL-HYDROCHLOROTHIAZIDE 10-12.5 MG PO TABS: 1.0000 | ORAL_TABLET | Freq: Every day | ORAL | 1 refills | Status: DC

## 2017-11-12 NOTE — Progress Notes (Signed)
Subjective:  By signing my name below, I, Gregory Booker, attest that this documentation has been prepared under the direction and in the presence of Wendie Agreste, MD Electronically Signed: Ladene Artist, ED Scribe 11/12/2017 at 9:46 AM.   Patient ID: Gregory Booker, male    DOB: [birth year], 57 y.o.   MRN: [id number]  Chief Complaint  Patient presents with  . Annual Exam    CPE   HPI Gregory Booker is a 57 y.o. male who presents to Primary Care at [facility] for an annual exam. Last OV in 07/2016. Pt is not fasting at this visit. He had a pepsi, sweet tea and [employer] snoball pastry around 6 AM this morning.  HTN Lisinopril-HCTZ 20-12.5 mg at that visit. - Denies cp, lightheadedness, dizziness, any symptoms. Pt does not have a BP machine at home. Lab Results  Component Value Date   CREATININE 0.98 08/11/2016   BP Readings from Last 3 Encounters:  11/12/17 102/66  08/11/16 100/64  06/30/15 102/70   Hyperlipidemia Lab Results  Component Value Date   CHOL 143 08/11/2016   HDL 35 (L) 08/11/2016   LDLCALC 82 08/11/2016   TRIG 129 08/11/2016   CHOLHDL 4.1 08/11/2016   Lab Results  Component Value Date   ALT 11 08/11/2016   AST 12 08/11/2016   ALKPHOS 39 08/11/2016   BILITOT 0.5 08/11/2016  Pravastatin 40 mg qd. - Denies myalgias or new side-effects.  CA Screening Colonoscopy: referred for colonoscopy last yr, pt did not show to appointments or call to reschedule so referral was closed - Pt states that he does not want to have a colonoscopy due to the actual procedure and fear of things occurring around him/people passing. Denies depression, anxiety. Prostate CA Screening: referred to [facility] last yr. - Pt has not met with urology. Denies difficulty urinating, urinary frequency. He agrees to PSA at this visit but would like to defer DRE at this time. Lab Results  Component Value Date   PSA 0.86 06/30/2015   PSA 1.15 01/28/2014   Immunizations Immunization  History  Administered Date(s) Administered  . Tdap 08/11/2016  Shingrix: pt would like to defer at this visit  Depression Screening Depression screen [facility] 2/9 11/12/2017 08/11/2016 06/30/2015 09/29/2014  Decreased Interest 0 0 0 0  Down, Depressed, Hopeless 0 0 0 0  PHQ - 2 Score 0 0 0 0    Visual Acuity Screening   Right eye Left eye Both eyes  Without correction: 20/30 20/40 20/25   With correction:      Vision: Wears glasses. Followed by [facility] but has not been seen recently.  Dentist: Has not been seen recently Exercise: 6 days/wk  Patient Active Problem List   Diagnosis Date Noted  . Hyperlipidemia 06/30/2015  . HTN (hypertension) 09/29/2014   Past Medical History:  Diagnosis Date  . Hyperlipidemia   . Hypertension    Past Surgical History:  Procedure Laterality Date  . HERNIA REPAIR     No Known Allergies Prior to Admission medications   Medication Sig Start Date End Date Taking? Authorizing Provider  lisinopril-hydrochlorothiazide (PRINZIDE,ZESTORETIC) 20-12.5 MG tablet TAKE 1 TABLET BY MOUTH EVERY DAY 10/29/17   Wendie Agreste, MD  pravastatin (PRAVACHOL) 40 MG tablet TAKE 1 TABLET BY MOUTH EVERY DAY 10/29/17   Wendie Agreste, MD  psyllium (REGULOID) 0.52 G capsule Take 1.04 g by mouth daily. Reported on 06/30/2015    [provider]  tamsulosin (FLOMAX) 0.4 MG CAPS capsule Take  1 capsule (0.4 mg total) by mouth daily after supper. Patient not taking: Reported on 08/11/2016 10/04/15   Wardell Honour, MD   Social History   Socioeconomic History  . Marital status: Single    Spouse name: Not on file  . Number of children: Not on file  . Years of education: Not on file  . Highest education level: Not on file  Occupational History  . Not on file  Social Needs  . Financial resource strain: Not on file  . Food insecurity:    Worry: Not on file    Inability: Not on file  . Transportation needs:    Medical: Not on file    Non-medical: Not on file    Tobacco Use  . Smoking status: Never Smoker  . Smokeless tobacco: Never Used  Substance and Sexual Activity  . Alcohol use: No  . Drug use: No  . Sexual activity: Not on file  Lifestyle  . Physical activity:    Days per week: Not on file    Minutes per session: Not on file  . Stress: Not on file  Relationships  . Social connections:    Talks on phone: Not on file    Gets together: Not on file    Attends religious service: Not on file    Active member of club or organization: Not on file    Attends meetings of clubs or organizations: Not on file    Relationship status: Not on file  . Intimate partner violence:    Fear of current or ex partner: Not on file    Emotionally abused: Not on file    Physically abused: Not on file    Forced sexual activity: Not on file  Other Topics Concern  . Not on file  Social History Narrative  . Not on file   Review of Systems  Cardiovascular: Negative for chest pain.  Genitourinary: Negative for difficulty urinating and frequency.  Musculoskeletal: Negative for myalgias.  Neurological: Negative for dizziness and light-headedness.  Psychiatric/Behavioral: Negative for dysphoric mood. The patient is not nervous/anxious.       Objective:   Physical Exam  Constitutional: He is oriented to person, place, and time. He appears well-developed and well-nourished.  HENT:  Head: Normocephalic and atraumatic.  Right Ear: External ear normal.  Left Ear: External ear normal.  Mouth/Throat: Oropharynx is clear and moist.  Eyes: Pupils are equal, round, and reactive to light. Conjunctivae and EOM are normal.  Neck: Normal range of motion. Neck supple. No thyromegaly present.  Cardiovascular: Normal rate, regular rhythm, normal heart sounds and intact distal pulses.  Pulmonary/Chest: Effort normal and breath sounds normal. No respiratory distress. He has no wheezes.  Abdominal: Soft. He exhibits no distension. There is no tenderness.  Genitourinary:   Genitourinary Comments: dre declined  Musculoskeletal: Normal range of motion. He exhibits no edema or tenderness.  Lymphadenopathy:    He has no cervical adenopathy.  Neurological: He is alert and oriented to person, place, and time. He has normal reflexes.  Skin: Skin is warm and dry.  Psychiatric: He has a normal mood and affect. His behavior is normal.  Vitals reviewed.    Vitals:   11/12/17 0909  BP: 102/66  Pulse: 83  Temp: 98.7 F (37.1 C)  TempSrc: Oral  SpO2: 98%  Weight: 158 lb 12.8 oz (72 kg)  Height: 5' 5"  (1.651 m)      Assessment & Plan:   Gregory Booker is a 57 y.o.  male Annual physical exam  - -anticipatory guidance as below in AVS, screening labs above. Health maintenance items as above in HPI discussed/recommended as applicable.   -Recommended follow-up with ophthalmology and phone numbers for dentist.  -Improved diet discussed and handout given for physical.  Screen for colon cancer - Plan: Cologuard  -Declined colonoscopy, agrees to Cologuard.  Understands potential further testing needed, risk of false positives, and intervals.  Pure hypercholesterolemia - Plan: pravastatin (PRAVACHOL) 40 MG tablet, Lipid panel  -Tolerating pravastatin, continue same dose, lipid panel pending  Benign essential HTN - Plan: Comprehensive metabolic panel, lisinopril-hydrochlorothiazide (PRINZIDE,ZESTORETIC) 10-12.5 MG tablet  -Readings on the lower side, although asymptomatic.  I suspect he will be able to tolerate a lower dose of combo pill.  Will decrease lisinopril HCTZ to 10/12.5 mg, monitor outside readings and RTC precautions if significant elevation.  Screening for prostate cancer - Plan: PSA  -.We discussed pros and cons of prostate cancer screening, and after this discussion, he chose to have screening done with PSA only, deferred DRE   Meds ordered this encounter  Medications  . pravastatin (PRAVACHOL) 40 MG tablet    Sig: Take 1 tablet (40 mg total) by  mouth daily.    Dispense:  90 tablet    Refill:  1  . lisinopril-hydrochlorothiazide (PRINZIDE,ZESTORETIC) 10-12.5 MG tablet    Sig: Take 1 tablet by mouth daily.    Dispense:  90 tablet    Refill:  1   Patient Instructions   Thank you for coming in today.   Blood pressure is running a little low. We can try a lower dose of combo pill.  Keep a record of your blood pressures outside of the office and if those readings are over 140/90 - let me know as will ned to change back to other pill.   I will order Cologuard for colon cancer screening.   Let me know if you change your mind about shingles vaccine.   I will check labs, but if elevated may need to repeat after fasting for 8 hours.   Call [facility] for an appointment.   Here are some dentist options.   [facility]  Address: [address], [place], [place] [address]  [email or web address]  Phone: [phone]   [facility]  Address: [address] [place], [place] [address] Phone: [phone]   Keeping you healthy  Get these tests  Blood pressure- Have your blood pressure checked once a year by your healthcare provider.  Normal blood pressure is 120/80  Weight- Have your body mass index (BMI) calculated to screen for obesity.  BMI is a measure of body fat based on height and weight. You can also calculate your own BMI at [email or web address].  Cholesterol- Have your cholesterol checked every year.  Diabetes- Have your blood sugar checked regularly if you have high blood pressure, high cholesterol, have a family history of diabetes or if you are overweight.  Screening for Colon Cancer- Colonoscopy starting at age 76.  Screening may begin sooner depending on your family history and other health conditions. Follow up colonoscopy as directed by your Gastroenterologist.  Screening for Prostate Cancer- Both blood work (PSA) and a rectal exam help screen for Prostate Cancer.   Screening begins at age 5 with African-American men and at age 78 with Caucasian men.  Screening may begin sooner depending on your family history.  Take these medicines  Aspirin- One aspirin daily can help prevent Heart disease and Stroke.  Flu shot- Every fall.  Tetanus- Every 10 years.  Zostavax- Once after the age of 41 to prevent Shingles.  Pneumonia shot- Once after the age of 59; if you are younger than 19, ask your healthcare provider if you need a Pneumonia shot.  Take these steps  Don't smoke- If you do smoke, talk to your doctor about quitting.  For tips on how to quit, go to www.smokefree.gov or call 1-800-QUIT-NOW.  Be physically active- Exercise 5 days a week for at least 30 minutes.  If you are not already physically active start slow and gradually work up to 30 minutes of moderate physical activity.  Examples of moderate activity include walking briskly, mowing the yard, dancing, swimming, bicycling, etc.  Eat a healthy diet- Eat a variety of healthy food such as fruits, vegetables, low fat milk, low fat cheese, yogurt, lean meant, poultry, fish, beans, tofu, etc. For more information go to www.thenutritionsource.org  Drink alcohol in moderation- Limit alcohol intake to less than two drinks a day. Never drink and drive.  Dentist- Brush and floss twice daily; visit your dentist twice a year.  Depression- Your emotional health is as important as your physical health. If you're feeling down, or losing interest in things you would normally enjoy please talk to your healthcare provider.  Eye exam- Visit your eye doctor every year.  Safe sex- If you may be exposed to a sexually transmitted infection, use a condom.  Seat belts- Seat belts can save your life; always wear one.  Smoke/Carbon Monoxide detectors- These detectors need to be installed on the appropriate level of your home.  Replace batteries at least once a year.  Skin cancer- When out in the sun, cover up and  use sunscreen 15 SPF or higher.  Violence- If anyone is threatening you, please tell your healthcare provider.  Living Will/ Health care power of attorney- Speak with your healthcare provider and family.   I will contact you with your lab results within the next 2 weeks.  If you have not heard from [place] then please contact us. The fastest way to get your results is to register for My Chart.   IF you received an x-ray today, you will receive an invoice from [facility]. Please contact [facility] at [phone] with questions or concerns regarding your invoice.   IF you received labwork today, you will receive an invoice from [facility]. Please contact [facility] at [phone] with questions or concerns regarding your invoice.   Our billing staff will not be able to assist you with questions regarding bills from these companies.  You will be contacted with the lab results as soon as they are available. The fastest way to get your results is to activate your My Chart account. Instructions are located on the last page of this paperwork. If you have not heard from [place] regarding the results in 2 weeks, please contact this office.       I personally performed the services described in this documentation, which was scribed in my presence. The recorded information has been reviewed and considered for accuracy and completeness, addended by me as needed, and agree with information above.  Signed,   Merri Ray, MD [facility] at [place].  11/12/17 10:44 AM

## 2017-11-12 NOTE — Patient Instructions (Addendum)
Thank you for coming in today.   Blood pressure is running a little low. We can try a lower dose of combo pill.  Keep a record of your blood pressures outside of the office and if those readings are over 140/90 - let me know as will ned to change back to other pill.   I will order Cologuard for colon cancer screening.   Let me know if you change your mind about shingles vaccine.   I will check labs, but if elevated may need to repeat after fasting for 8 hours.   Call [facility] for an appointment.   Here are some dentist options.   [facility]  Address: [address], [place], [place] [address]  [email or web address]  Phone: [phone]   [facility]  Address: [address], [place], [place] [address] Phone: [phone]   Keeping you healthy  Get these tests  Blood pressure- Have your blood pressure checked once a year by your healthcare provider.  Normal blood pressure is 120/80  Weight- Have your body mass index (BMI) calculated to screen for obesity.  BMI is a measure of body fat based on height and weight. You can also calculate your own BMI at [email or web address]/.  Cholesterol- Have your cholesterol checked every year.  Diabetes- Have your blood sugar checked regularly if you have high blood pressure, high cholesterol, have a family history of diabetes or if you are overweight.  Screening for Colon Cancer- Colonoscopy starting at age 59.  Screening may begin sooner depending on your family history and other health conditions. Follow up colonoscopy as directed by your Gastroenterologist.  Screening for Prostate Cancer- Both blood work (PSA) and a rectal exam help screen for Prostate Cancer.  Screening begins at age 59 with African-American men and at age 550 with Caucasian men.  Screening may begin sooner depending on your family history.  Take these medicines  Aspirin- One aspirin daily can help prevent Heart disease and  Stroke.  Flu shot- Every fall.  Tetanus- Every 10 years.  Zostavax- Once after the age of 59 to prevent Shingles.  Pneumonia shot- Once after the age of 59; if you are younger than 3665, ask your healthcare provider if you need a Pneumonia shot.  Take these steps  Don't smoke- If you do smoke, talk to your doctor about quitting.  For tips on how to quit, go to www.smokefree.gov or call 1-800-QUIT-NOW.  Be physically active- Exercise 5 days a week for at least 30 minutes.  If you are not already physically active start slow and gradually work up to 30 minutes of moderate physical activity.  Examples of moderate activity include walking briskly, mowing the yard, dancing, swimming, bicycling, etc.  Eat a healthy diet- Eat a variety of healthy food such as fruits, vegetables, low fat milk, low fat cheese, yogurt, lean meant, poultry, fish, beans, tofu, etc. For more information go to www.thenutritionsource.org  Drink alcohol in moderation- Limit alcohol intake to less than two drinks a day. Never drink and drive.  Dentist- Brush and floss twice daily; visit your dentist twice a year.  Depression- Your emotional health is as important as your physical health. If you're feeling down, or losing interest in things you would normally enjoy please talk to your healthcare provider.  Eye exam- Visit your eye doctor every year.  Safe sex- If you may be exposed to a sexually transmitted infection, use a condom.  Seat belts- Seat belts can save your  life; always wear one.  Smoke/Carbon Monoxide detectors- These detectors need to be installed on the appropriate level of your home.  Replace batteries at least once a year.  Skin cancer- When out in the sun, cover up and use sunscreen 15 SPF or higher.  Violence- If anyone is threatening you, please tell your healthcare provider.  Living Will/ Health care power of attorney- Speak with your healthcare provider and family.   I will contact you with  your lab results within the next 2 weeks.  If you have not heard from us then please contact us. The fastest way to get your results is to register for My Chart.   IF you received an x-ray today, you will receive an invoice from [facility]. Please contact [facility] at [phone] with questions or concerns regarding your invoice.   IF you received labwork today, you will receive an invoice from [facility]. Please contact [facility] at [phone] with questions or concerns regarding your invoice.   Our billing staff will not be able to assist you with questions regarding bills from these companies.  You will be contacted with the lab results as soon as they are available. The fastest way to get your results is to activate your My Chart account. Instructions are located on the last page of this paperwork. If you have not heard from us regarding the results in 2 weeks, please contact this office.

## 2017-11-13 LAB — LIPID PANEL
CHOL/HDL RATIO: 3.3 ratio (ref 0.0–5.0)
Cholesterol, Total: 132 mg/dL (ref 100–199)
HDL: 40 mg/dL (ref >39)
LDL CALC: 78 mg/dL (ref 0–99)
TRIGLYCERIDES: 69 mg/dL (ref 0–149)
VLDL Cholesterol Cal: 14 mg/dL (ref 5–40)

## 2017-11-13 LAB — COMPREHENSIVE METABOLIC PANEL
ALT: 20 IU/L (ref 0–44)
AST: 22 IU/L (ref 0–40)
Albumin/Globulin Ratio: 1.4 (ref 1.2–2.2)
Albumin: 4.2 g/dL (ref 3.5–5.5)
Alkaline Phosphatase: 35 IU/L — ABNORMAL LOW (ref 39–117)
BILIRUBIN TOTAL: 0.3 mg/dL (ref 0.0–1.2)
BUN/Creatinine Ratio: 15 (ref 9–20)
BUN: 15 mg/dL (ref 6–24)
CHLORIDE: 99 mmol/L (ref 96–106)
CO2: 22 mmol/L (ref 20–29)
Calcium: 8.8 mg/dL (ref 8.7–10.2)
Creatinine, Ser: 1.01 mg/dL (ref 0.76–1.27)
GFR calc Af Amer: 94 mL/min/{1.73_m2} (ref >59)
GFR calc non Af Amer: 82 mL/min/{1.73_m2} (ref >59)
GLUCOSE: 95 mg/dL (ref 65–99)
Globulin, Total: 2.9 g/dL (ref 1.5–4.5)
Potassium: 3.8 mmol/L (ref 3.5–5.2)
Sodium: 135 mmol/L (ref 134–144)
Total Protein: 7.1 g/dL (ref 6.0–8.5)

## 2017-11-13 LAB — PSA: PROSTATE SPECIFIC AG, SERUM: 1.3 ng/mL (ref 0.0–4.0)

## 2017-11-21 ENCOUNTER — Other Ambulatory Visit: Payer: Self-pay | Admitting: Family Medicine

## 2017-11-21 DIAGNOSIS — I10 Essential (primary) hypertension: Secondary | ICD-10-CM

## 2017-12-02 ENCOUNTER — Encounter: Payer: Self-pay | Admitting: *Deleted

## 2017-12-21 ENCOUNTER — Emergency Department (HOSPITAL_COMMUNITY): Payer: BC Managed Care – PPO

## 2017-12-21 ENCOUNTER — Emergency Department (HOSPITAL_COMMUNITY)
Admission: EM | Admit: 2017-12-21 | Discharge: 2017-12-22 | Disposition: A | Discharge status: Home / Self Care | Payer: BC Managed Care – PPO | Attending: Emergency Medicine | Admitting: Emergency Medicine

## 2017-12-21 ENCOUNTER — Encounter (HOSPITAL_COMMUNITY): Payer: Self-pay | Admitting: Emergency Medicine

## 2017-12-21 ENCOUNTER — Other Ambulatory Visit: Payer: Self-pay

## 2017-12-21 DIAGNOSIS — I10 Essential (primary) hypertension: Secondary | ICD-10-CM | POA: Diagnosis not present

## 2017-12-21 DIAGNOSIS — Y92410 Unspecified street and highway as the place of occurrence of the external cause: Secondary | ICD-10-CM | POA: Diagnosis not present

## 2017-12-21 DIAGNOSIS — Y9301 Activity, walking, marching and hiking: Secondary | ICD-10-CM | POA: Insufficient documentation

## 2017-12-21 DIAGNOSIS — T148XXA Other injury of unspecified body region, initial encounter: Secondary | ICD-10-CM | POA: Diagnosis not present

## 2017-12-21 DIAGNOSIS — S060X9A Concussion with loss of consciousness of unspecified duration, initial encounter: Secondary | ICD-10-CM | POA: Diagnosis not present

## 2017-12-21 DIAGNOSIS — Y999 Unspecified external cause status: Secondary | ICD-10-CM | POA: Insufficient documentation

## 2017-12-21 DIAGNOSIS — M542 Cervicalgia: Secondary | ICD-10-CM | POA: Diagnosis present

## 2017-12-21 DIAGNOSIS — R52 Pain, unspecified: Secondary | ICD-10-CM

## 2017-12-21 DIAGNOSIS — T07XXXA Unspecified multiple injuries, initial encounter: Secondary | ICD-10-CM

## 2017-12-21 HISTORY — DX: Pure hypercholesterolemia, unspecified: E78.00

## 2017-12-21 LAB — SAMPLE TO BLOOD BANK

## 2017-12-21 LAB — COMPREHENSIVE METABOLIC PANEL
ALBUMIN: 3.8 g/dL (ref 3.5–5.0)
ALT: 17 U/L (ref 0–44)
AST: 23 U/L (ref 15–41)
Alkaline Phosphatase: 31 U/L — ABNORMAL LOW (ref 38–126)
Anion gap: 12 (ref 5–15)
BUN: 14 mg/dL (ref 6–20)
CHLORIDE: 99 mmol/L (ref 98–111)
CO2: 24 mmol/L (ref 22–32)
Calcium: 8.9 mg/dL (ref 8.9–10.3)
Creatinine, Ser: 1.09 mg/dL (ref 0.61–1.24)
GFR calc Af Amer: >60 mL/min (ref >60)
GLUCOSE: 119 mg/dL — AB (ref 70–99)
POTASSIUM: 3.4 mmol/L — AB (ref 3.5–5.1)
Sodium: 135 mmol/L (ref 135–145)
Total Bilirubin: 0.6 mg/dL (ref 0.3–1.2)
Total Protein: 7.1 g/dL (ref 6.5–8.1)

## 2017-12-21 LAB — CDS SEROLOGY

## 2017-12-21 LAB — CBC
HEMATOCRIT: 38.1 % — AB (ref 39.0–52.0)
Hemoglobin: 13 g/dL (ref 13.0–17.0)
MCH: 31.9 pg (ref 26.0–34.0)
MCHC: 34.1 g/dL (ref 30.0–36.0)
MCV: 93.6 fL (ref 78.0–100.0)
PLATELETS: 209 10*3/uL (ref 150–400)
RBC: 4.07 MIL/uL — ABNORMAL LOW (ref 4.22–5.81)
RDW: 12 % (ref 11.5–15.5)
WBC: 11.5 10*3/uL — AB (ref 4.0–10.5)

## 2017-12-21 LAB — PROTIME-INR
INR: 0.99
Prothrombin Time: 13 seconds (ref 11.4–15.2)

## 2017-12-21 LAB — ETHANOL

## 2017-12-21 MED ORDER — IOHEXOL 300 MG/ML  SOLN
100.0000 mL | Freq: Once | INTRAMUSCULAR | Status: AC | PRN
  Administered 2017-12-21: 100 mL via INTRAVENOUS
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
## 2017-12-22 ENCOUNTER — Encounter: Payer: Self-pay | Admitting: Family Medicine

## 2017-12-22 LAB — URINALYSIS, ROUTINE W REFLEX MICROSCOPIC
BILIRUBIN URINE: NEGATIVE
Glucose, UA: NEGATIVE mg/dL
HGB URINE DIPSTICK: NEGATIVE
KETONES UR: NEGATIVE mg/dL
Leukocytes, UA: NEGATIVE
Nitrite: NEGATIVE
PH: 6 (ref 5.0–8.0)
PROTEIN: NEGATIVE mg/dL
Specific Gravity, Urine: >1.046 — ABNORMAL HIGH (ref 1.005–1.030)
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
## 2017-12-22 NOTE — ED Notes (Signed)
Patient is alert oriented , aware he is being obs. Til am

## 2017-12-28 ENCOUNTER — Other Ambulatory Visit: Payer: Self-pay

## 2017-12-28 ENCOUNTER — Ambulatory Visit: Payer: BC Managed Care – PPO | Admitting: Family Medicine

## 2017-12-28 ENCOUNTER — Encounter: Payer: Self-pay | Admitting: Family Medicine

## 2017-12-28 VITALS — BP 113/75 | HR 88 | Temp 98.3°F | Resp 16 | Ht 65.0 in | Wt 158.4 lb

## 2017-12-28 DIAGNOSIS — M25562 Pain in left knee: Secondary | ICD-10-CM | POA: Diagnosis not present

## 2017-12-28 DIAGNOSIS — S060X9D Concussion with loss of consciousness of unspecified duration, subsequent encounter: Secondary | ICD-10-CM

## 2017-12-28 NOTE — Patient Instructions (Addendum)
ASPERCREME WITH LIDOCAINE PATCH  Apply this to the area of pain    If you have lab work done today you will be contacted with your lab results within the next 2 weeks.  If you have not heard from [place] then please contact us. The fastest way to get your results is to register for My Chart.   IF you received an x-ray today, you will receive an invoice from [facility]. Please contact [facility] at [phone] with questions or concerns regarding your invoice.   IF you received labwork today, you will receive an invoice from [place]. Please contact [facility] at [phone] with questions or concerns regarding your invoice.   Our billing staff will not be able to assist you with questions regarding bills from these companies.  You will be contacted with the lab results as soon as they are available. The fastest way to get your results is to activate your My Chart account. Instructions are located on the last page of this paperwork. If you have not heard from [place] regarding the results in 2 weeks, please contact this office.     Cryotherapy WHAT IS CRYOTHERAPY? Cryotherapy, or cold therapy, is a treatment that uses cold temperatures to treat an injury or medical condition. It includes using cold packs or ice packs to reduce pain and swelling. WHO SHOULD NOT USE CRYOTHERAPY? Cryotherapy is not safe for people who cannot tell you if they are in pain, such as small children and people who have dementia. Cryotherapy is also not safe for people with certain conditions, such as:  Raynaud phenomenon.  Cold hypersensitivity.  Numbness or loss of feeling in the area being iced.  Cryotherapy may or may not be safe for people with certain other conditions. Do not use cryotherapy without your health care provider's approval if you have:  A heart condition.  High blood pressure.  Open or healing wounds.  An infection.  Rheumatoid arthritis.  Poor  circulation.  Diabetes.  Certain skin conditions.  HOW DO I USE CRYOTHERAPY? To use cryotherapy at home to reduce pain and swelling:  Place a towel between the cold source and your skin.  Apply the cold source for no more than 20 minutes at a time.  Check your skin after 5 minutes to make sure there are no signs of a poor response to cold or skin damage. Check for: ? White spots on your skin. Your skin may look blotchy or mottled. ? Skin that looks blue or pale. ? Skin that feels waxy or hard.  Repeat these steps as many times each day as told by your health care provider.  HOW CAN I MAKE A COLD PACK? When using a cold pack at home to reduce pain and swelling, you can use:  A silica gel cold pack that has been left in the freezer. You can buy this online or in stores.  A plastic bag of frozen vegetables.  A sealable plastic bag that has been filled with crushed ice.  Always wrap the pack in a dry or damp towel to avoid direct contact with your skin. WHEN SHOULD I CALL MY HEALTH CARE PROVIDER? Call your health care provider if:  You develop white spots on your skin. This may give your skin a blotchy or mottled look.  Your skin turns blue or pale.  Your skin becomes waxy or hard.  Your swelling gets worse.  This information is not intended to replace advice given to you by your health care  provider. Make sure you discuss any questions you have with your health care provider. Document Released: 11/11/2010 Document Revised: 08/23/2015 Document Reviewed: 11/29/2014 Elsevier Interactive Patient Education  2017 [employer].
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
## 2018-04-30 ENCOUNTER — Other Ambulatory Visit: Payer: Self-pay | Admitting: Family Medicine

## 2018-04-30 DIAGNOSIS — E7800 Pure hypercholesterolemia, unspecified: Secondary | ICD-10-CM

## 2018-04-30 DIAGNOSIS — I10 Essential (primary) hypertension: Secondary | ICD-10-CM

## 2018-07-25 ENCOUNTER — Other Ambulatory Visit: Payer: Self-pay | Admitting: Family Medicine

## 2018-07-25 DIAGNOSIS — I10 Essential (primary) hypertension: Secondary | ICD-10-CM

## 2018-08-20 ENCOUNTER — Other Ambulatory Visit: Payer: Self-pay | Admitting: Family Medicine

## 2018-08-20 DIAGNOSIS — I10 Essential (primary) hypertension: Secondary | ICD-10-CM

## 2018-08-20 NOTE — Telephone Encounter (Signed)
Requested medication (s) are due for refill today -yes  Requested medication (s) are on the active medication list -yes  Future visit scheduled -no  Last refill: 07/25/18  Notes to clinic: Patient has had 30 days refill with note to schedule appointment- sent for review of request  Requested Prescriptions  Pending Prescriptions Disp Refills   lisinopril-hydrochlorothiazide (ZESTORETIC) 10-12.5 MG tablet [Pharmacy Med Name: LISINOPRIL-HCTZ 10-12.5 MG TAB] 30 tablet 0    Sig: TAKE 1 TABLET BY MOUTH EVERY DAY     Cardiovascular:  ACEI + Diuretic Combos Failed - 08/20/2018  1:34 PM      Failed - Na in normal range and within 180 days    Sodium  Date Value Ref Range Status  12/21/2017 135 135 - 145 mmol/L Final  11/12/2017 135 134 - 144 mmol/L Final         Failed - K in normal range and within 180 days    Potassium  Date Value Ref Range Status  12/21/2017 3.4 (L) 3.5 - 5.1 mmol/L Final         Failed - Cr in normal range and within 180 days    Creat  Date Value Ref Range Status  06/30/2015 0.99 0.70 - 1.33 mg/dL Final   Creatinine, Ser  Date Value Ref Range Status  12/21/2017 1.09 0.61 - 1.24 mg/dL Final         Failed - Ca in normal range and within 180 days    Calcium  Date Value Ref Range Status  12/21/2017 8.9 8.9 - 10.3 mg/dL Final         Failed - Valid encounter within last 6 months    Recent Outpatient Visits          7 months ago Acute pain of left knee   Primary Care at [facility], Manus Rudd, MD   9 months ago Annual physical exam   Primary Care at [facility], Asencion Partridge, MD   2 years ago Annual physical exam   Primary [facility] at [facility], Asencion Partridge, MD   3 years ago Annual physical exam   Primary Care at [facility], Sandria Bales, MD   3 years ago Essential hypertension   Primary Care at [facility], Sandria Bales, MD             Passed - Patient is not pregnant      Passed - Last BP in normal range    BP Readings from Last 1 Encounters:   12/22/17 105/76          Requested Prescriptions  Pending Prescriptions Disp Refills   lisinopril-hydrochlorothiazide (ZESTORETIC) 10-12.5 MG tablet [Pharmacy Med Name: LISINOPRIL-HCTZ 10-12.5 MG TAB] 30 tablet 0    Sig: TAKE 1 TABLET BY MOUTH EVERY DAY     Cardiovascular:  ACEI + Diuretic Combos Failed - 08/20/2018  1:34 PM      Failed - Na in normal range and within 180 days    Sodium  Date Value Ref Range Status  12/21/2017 135 135 - 145 mmol/L Final  11/12/2017 135 134 - 144 mmol/L Final         Failed - K in normal range and within 180 days    Potassium  Date Value Ref Range Status  12/21/2017 3.4 (L) 3.5 - 5.1 mmol/L Final         Failed - Cr in normal range and within 180 days    Creat  Date Value Ref Range Status  06/30/2015  0.99 0.70 - 1.33 mg/dL Final   Creatinine, Ser  Date Value Ref Range Status  12/21/2017 1.09 0.61 - 1.24 mg/dL Final         Failed - Ca in normal range and within 180 days    Calcium  Date Value Ref Range Status  12/21/2017 8.9 8.9 - 10.3 mg/dL Final         Failed - Valid encounter within last 6 months    Recent Outpatient Visits          7 months ago Acute pain of left knee   [facility], Manus RuddZoe A, MD   9 months ago Annual physical exam   [facility], Asencion PartridgeJeffrey R, MD   2 years ago Annual physical exam   [facility], Asencion PartridgeJeffrey R, MD   3 years ago Annual physical exam   [facility], Sandria Balesavid H, MD   3 years ago Essential hypertension   [facility] Sandria Balesavid H, MD             Passed - Patient is not pregnant      Passed - Last BP in normal range    BP Readings from Last 1 Encounters:  12/22/17 105/76

## 2018-08-30 ENCOUNTER — Other Ambulatory Visit: Payer: Self-pay | Admitting: Family Medicine

## 2018-08-30 DIAGNOSIS — I10 Essential (primary) hypertension: Secondary | ICD-10-CM

## 2018-08-30 NOTE — Telephone Encounter (Signed)
Requested medication (s) are due for refill today: Yes  Requested medication (s) are on the active medication list: Yes  Last refill:  07/25/18  Future visit scheduled: Yes  Notes to clinic:  Diagnosis code needed     Requested Prescriptions  Pending Prescriptions Disp Refills   lisinopril-hydrochlorothiazide (ZESTORETIC) 10-12.5 MG tablet [Pharmacy Med Name: LISINOPRIL-HCTZ 10-12.5 MG TAB] 30 tablet 0    Sig: TAKE 1 TABLET BY MOUTH EVERY DAY     Cardiovascular:  ACEI + Diuretic Combos Failed - 08/30/2018 12:08 PM      Failed - Na in normal range and within 180 days    Sodium  Date Value Ref Range Status  12/21/2017 135 135 - 145 mmol/L Final  11/12/2017 135 134 - 144 mmol/L Final         Failed - K in normal range and within 180 days    Potassium  Date Value Ref Range Status  12/21/2017 3.4 (L) 3.5 - 5.1 mmol/L Final         Failed - Cr in normal range and within 180 days    Creat  Date Value Ref Range Status  06/30/2015 0.99 0.70 - 1.33 mg/dL Final   Creatinine, Ser  Date Value Ref Range Status  12/21/2017 1.09 0.61 - 1.24 mg/dL Final         Failed - Ca in normal range and within 180 days    Calcium  Date Value Ref Range Status  12/21/2017 8.9 8.9 - 10.3 mg/dL Final         Failed - Valid encounter within last 6 months    Recent Outpatient Visits          8 months ago Acute pain of left knee   Primary Care at [facility], Manus Rudd, MD   9 months ago Annual physical exam   Primary Care at [facility], Asencion Partridge, MD   2 years ago Annual physical exam   Primary Care at [facility], Asencion Partridge, MD   3 years ago Annual physical exam   Primary Care at [facility], Sandria Bales, MD   3 years ago Essential hypertension   Primary Care at [facility], Sandria Bales, MD      Future Appointments            In 2 months Shade Flood, MD Primary Care at [place], [facility]           Passed - Patient is not pregnant      Passed - Last BP in normal range    BP  Readings from Last 1 Encounters:  12/22/17 105/76

## 2018-09-23 ENCOUNTER — Other Ambulatory Visit: Payer: Self-pay | Admitting: Family Medicine

## 2018-09-23 DIAGNOSIS — I10 Essential (primary) hypertension: Secondary | ICD-10-CM

## 2018-10-17 ENCOUNTER — Other Ambulatory Visit: Payer: Self-pay | Admitting: Family Medicine

## 2018-10-17 DIAGNOSIS — I10 Essential (primary) hypertension: Secondary | ICD-10-CM

## 2018-10-17 NOTE — Telephone Encounter (Signed)
Requested medication (s) are due for refill today: yes  Requested medication (s) are on the active medication list: yes  Last refill:  09/23/18  Future visit scheduled: yes  Notes to clinic:  Courtesy refill has already been given-please advise   Requested Prescriptions  Pending Prescriptions Disp Refills   lisinopril-hydrochlorothiazide (ZESTORETIC) 10-12.5 MG tablet [Pharmacy Med Name: LISINOPRIL-HCTZ 10-12.5 MG TAB] 30 tablet 0    Sig: Take 1 tablet by mouth daily. KEEP UPCOMING APPT     Cardiovascular:  ACEI + Diuretic Combos Failed - 10/17/2018  1:35 PM      Failed - Na in normal range and within 180 days    Sodium  Date Value Ref Range Status  12/21/2017 135 135 - 145 mmol/L Final  11/12/2017 135 134 - 144 mmol/L Final         Failed - K in normal range and within 180 days    Potassium  Date Value Ref Range Status  12/21/2017 3.4 (L) 3.5 - 5.1 mmol/L Final         Failed - Cr in normal range and within 180 days    Creat  Date Value Ref Range Status  06/30/2015 0.99 0.70 - 1.33 mg/dL Final   Creatinine, Ser  Date Value Ref Range Status  12/21/2017 1.09 0.61 - 1.24 mg/dL Final         Failed - Ca in normal range and within 180 days    Calcium  Date Value Ref Range Status  12/21/2017 8.9 8.9 - 10.3 mg/dL Final         Failed - Valid encounter within last 6 months    Recent Outpatient Visits          9 months ago Acute pain of left knee   Primary Care at [place], MD   11 months ago Annual physical exam   [facility] at [facility], Ranell Patrick, MD   2 years ago Annual physical exam   [facility] at [facility], Ranell Patrick, MD   3 years ago Annual physical exam   Primary Care at [facility], Fenton Malling, MD   4 years ago Essential hypertension   Primary Care at [facility], Fenton Malling, MD      Future Appointments            In 1 month Wendie Agreste, MD Primary Care at [place] [place] - Patient is not pregnant     Passed - Last BP in normal range    BP Readings from Last 1 Encounters:  12/22/17 105/76

## 2018-10-21 ENCOUNTER — Other Ambulatory Visit: Payer: Self-pay | Admitting: Family Medicine

## 2018-10-21 DIAGNOSIS — I10 Essential (primary) hypertension: Secondary | ICD-10-CM

## 2018-11-15 ENCOUNTER — Other Ambulatory Visit: Payer: Self-pay | Admitting: Family Medicine

## 2018-11-15 DIAGNOSIS — I10 Essential (primary) hypertension: Secondary | ICD-10-CM

## 2018-11-17 ENCOUNTER — Other Ambulatory Visit: Payer: Self-pay

## 2018-11-17 ENCOUNTER — Encounter: Payer: Self-pay | Admitting: Family Medicine

## 2018-11-17 ENCOUNTER — Ambulatory Visit (INDEPENDENT_AMBULATORY_CARE_PROVIDER_SITE_OTHER): Payer: BC Managed Care – PPO | Admitting: Family Medicine

## 2018-11-17 VITALS — BP 108/74 | HR 94 | Temp 98.3°F | Resp 14 | Wt 156.0 lb

## 2018-11-17 DIAGNOSIS — R05 Cough: Secondary | ICD-10-CM | POA: Diagnosis not present

## 2018-11-17 DIAGNOSIS — E7800 Pure hypercholesterolemia, unspecified: Secondary | ICD-10-CM | POA: Diagnosis not present

## 2018-11-17 DIAGNOSIS — Z0001 Encounter for general adult medical examination with abnormal findings: Secondary | ICD-10-CM | POA: Diagnosis not present

## 2018-11-17 DIAGNOSIS — I10 Essential (primary) hypertension: Secondary | ICD-10-CM

## 2018-11-17 DIAGNOSIS — R1310 Dysphagia, unspecified: Secondary | ICD-10-CM

## 2018-11-17 DIAGNOSIS — Z0000 Encounter for general adult medical examination without abnormal findings: Secondary | ICD-10-CM

## 2018-11-17 DIAGNOSIS — Z1211 Encounter for screening for malignant neoplasm of colon: Secondary | ICD-10-CM | POA: Diagnosis not present

## 2018-11-17 DIAGNOSIS — R059 Cough, unspecified: Secondary | ICD-10-CM

## 2018-11-17 DIAGNOSIS — Z23 Encounter for immunization: Secondary | ICD-10-CM

## 2018-11-17 DIAGNOSIS — R12 Heartburn: Secondary | ICD-10-CM

## 2018-11-17 MED ORDER — SHINGRIX 50 MCG/0.5ML IM SUSR: 0.5000 mL | Freq: Once | INTRAMUSCULAR | 1 refills | Status: AC

## 2018-11-17 MED ORDER — PRAVASTATIN SODIUM 40 MG PO TABS: 40.0000 mg | ORAL_TABLET | Freq: Every day | ORAL | 1 refills | Status: DC

## 2018-11-17 MED ORDER — LISINOPRIL-HYDROCHLOROTHIAZIDE 10-12.5 MG PO TABS: 1.0000 | ORAL_TABLET | Freq: Every day | ORAL | 1 refills | Status: DC

## 2018-11-17 MED ORDER — OMEPRAZOLE 20 MG PO CPDR: 20.0000 mg | DELAYED_RELEASE_CAPSULE | Freq: Every day | ORAL | 1 refills | Status: DC

## 2018-11-17 NOTE — Progress Notes (Signed)
Subjective:    Patient ID: Gregory Booker, male    DOB: [birth year], 58 y.o.   MRN: [id number]  HPI Gregory Booker is a 58 y.o. male Presents today for: Chief Complaint  Patient presents with  . Annual Exam    wake up coughing since 11/05/18 only have the coughing at night. Felt feverish 11/04/18 it only happen thatone day Also have a problem with swollowing food.    Here for annual exam and other concerns as above.  Hypertension: BP Readings from Last 3 Encounters:  11/17/18 108/74  12/28/17 113/75  12/22/17 105/76   Lab Results  Component Value Date   CREATININE 1.09 12/21/2017  Lisinopril HCTZ 10/12.5 mg daily. No new side effects, no home readings.   Hyperlipidemia:  Lab Results  Component Value Date   CHOL 132 11/12/2017   HDL 40 11/12/2017   LDLCALC 78 11/12/2017   TRIG 69 11/12/2017   CHOLHDL 3.3 11/12/2017   Lab Results  Component Value Date   ALT 17 12/21/2017   AST 23 12/21/2017   ALKPHOS 31 (L) 12/21/2017   BILITOT 0.6 12/21/2017  Pravastatin 40 mg daily.  No new side effects.  Fasting today.   Cough: Started 8/8 at night. Woke up with cough at midnight. Felt feverish that night only, did not check temp. No fever feeling since that time.  Dry cough, itching throat feeling. Some heartburn that night.  No known sick contacts or Covid contacts known.  feeling better recently. Only slight cough with lying down at times now.  No dyspnea, no loss of taste/smell, no n/v/body aches.   Dysphagia: Reports trouble swallowing past year.  Liquids ok. Sometimes has to regurgitate to rechew food - feels stuck. Most of the time.  No night sweats/unexplained weight loss.   Cancer screening: Colonoscopy is overdue. Will refer.  Prostate: PSA normal at 1.3 in August 2019 - declines screening today after r/b discussion.   Immunization History  Administered Date(s) Administered  . Tdap 08/11/2016  Shingles: agrees to vaccine.   Depression screen [facility] 2/9  11/17/2018 12/28/2017 11/12/2017 08/11/2016 06/30/2015  Decreased Interest 0 0 0 0 0  Down, Depressed, Hopeless 0 0 0 0 0  PHQ - 2 Score 0 0 0 0 0    Hearing Screening   125Hz  250Hz  500Hz  1000Hz  2000Hz  3000Hz  4000Hz  6000Hz  8000Hz   Right ear:           Left ear:             Visual Acuity Screening   Right eye Left eye Both eyes  Without correction: 20/30 20/40 20/25   With correction:     no recent optho eval. Has glasses.   Dental: plans on establishing dentist.   Exercise: push ups, pull ups, riding bicycle, walking  - over per week.  No regular alcohol or tobacco use. Only Christiane Ha when cold/sick.    Patient Active Problem List   Diagnosis Date Noted  . Hyperlipidemia 06/30/2015  . HTN (hypertension) 09/29/2014   Past Medical History:  Diagnosis Date  . Hypercholesteremia   . Hyperlipidemia   . Hypertension    Past Surgical History:  Procedure Laterality Date  . HERNIA REPAIR     No Known Allergies Prior to Admission medications   Medication Sig Start Date End Date Taking? Authorizing Provider  lisinopril-hydrochlorothiazide (ZESTORETIC) 10-12.5 MG tablet TAKE 1 TABLET BY MOUTH DAILY. KEEP UPCOMING APPT 11/15/18  Yes Shade Flood, MD  pravastatin (PRAVACHOL) 40 MG tablet TAKE 1  TABLET BY MOUTH EVERY DAY 04/30/18  Yes Shade FloodGreene, Omri Bertran R, MD   Social History   Socioeconomic History  . Marital status: Single    Spouse name: Not on file  . Number of children: Not on file  . Years of education: Not on file  . Highest education level: Not on file  Occupational History  . Not on file  Social Needs  . Financial resource strain: Not on file  . Food insecurity    Worry: Not on file    Inability: Not on file  . Transportation needs    Medical: Not on file    Non-medical: Not on file  Tobacco Use  . Smoking status: Never Smoker  . Smokeless tobacco: Never Used  Substance and Sexual Activity  . Alcohol use: Not Currently  . Drug use: Never  . Sexual  activity: Not on file  Lifestyle  . Physical activity    Days per week: Not on file    Minutes per session: Not on file  . Stress: Not on file  Relationships  . Social Musicianconnections    Talks on phone: Not on file    Gets together: Not on file    Attends religious service: Not on file    Active member of club or organization: Not on file    Attends meetings of clubs or organizations: Not on file    Relationship status: Not on file  . Intimate partner violence    Fear of current or ex partner: Not on file    Emotionally abused: Not on file    Physically abused: Not on file    Forced sexual activity: Not on file  Other Topics Concern  . Not on file  Social History Narrative   ** Merged History Encounter **        Review of Systems 13 point review of systems per patient health survey noted.  Negative other than as indicated above or in HPI.      Objective:   Physical Exam Vitals signs reviewed.  Constitutional:      Appearance: He is well-developed.  HENT:     Head: Normocephalic and atraumatic.     Right Ear: External ear normal.     Left Ear: External ear normal.  Eyes:     Conjunctiva/sclera: Conjunctivae normal.     Pupils: Pupils are equal, round, and reactive to light.  Neck:     Musculoskeletal: Normal range of motion and neck supple.     Thyroid: No thyromegaly.  Cardiovascular:     Rate and Rhythm: Normal rate and regular rhythm.     Heart sounds: Normal heart sounds.  Pulmonary:     Effort: Pulmonary effort is normal. No respiratory distress.     Breath sounds: Normal breath sounds. No stridor. No wheezing or rhonchi.     Comments: No cough during visit.  Abdominal:     General: There is no distension.     Palpations: Abdomen is soft.     Tenderness: There is no abdominal tenderness.     Hernia: There is no hernia in the left inguinal area.  Musculoskeletal: Normal range of motion.        General: No tenderness.  Lymphadenopathy:     Cervical: No  cervical adenopathy.  Skin:    General: Skin is warm and dry.  Neurological:     Mental Status: He is alert and oriented to person, place, and time.     Deep Tendon Reflexes: Reflexes  are normal and symmetric.  Psychiatric:        Mood and Affect: Mood normal.        Behavior: Behavior normal.    Vitals:   11/17/18 0804  BP: 108/74  Pulse: 94  Resp: 14  Temp: 98.3 F (36.8 C)  TempSrc: Oral  SpO2: 100%  Weight: 156 lb (70.8 kg)        Assessment & Plan:    Gregory Booker is a 58 y.o. male Annual physical exam  - -anticipatory guidance as below in AVS, screening labs above. Health maintenance items as above in HPI discussed/recommended as applicable.   -Follow-up with dentist and ophthalmology/optometry recommended.  Cough - Plan: omeprazole (PRILOSEC) 20 MG capsule Dysphagia, unspecified type - Plan: omeprazole (PRILOSEC) 20 MG capsule, Ambulatory referral to Gastroenterology Heartburn  -Noted approximately 11 days ago.  Symptoms have improved.  Possible subjective fever initial night only, but no other infectious symptoms since that time.  No known sick contacts.  Specific infection testing deferred at this time is improving.  Differential includes reflux as notes cough with lying down as well as dysphagia symptoms.  On ACE inhibitor but less likely cause with more acute symptoms.  -Start omeprazole once per day, trigger foods discussed for reflux, refer to GI to evaluate for dysphasia symptoms, and recheck in 2 weeks.  Sooner if worse  Special screening for malignant neoplasms, colon - Plan: Ambulatory referral to Gastroenterology   Need for shingles vaccine - Plan: Zoster Vaccine Adjuvanted [facility]) injection  Pure hypercholesterolemia - Plan: Comprehensive metabolic panel, Lipid Panel, pravastatin (PRAVACHOL) 40 MG tablet  -  Stable, tolerating current regimen. Medications refilled. Labs pending as above.   Benign essential HTN - Plan: Comprehensive metabolic  panel, lisinopril-hydrochlorothiazide (ZESTORETIC) 10-12.5 MG tablet  -  Stable, tolerating current regimen. Medications refilled. Labs pending as above.    Meds ordered this encounter  Medications  . omeprazole (PRILOSEC) 20 MG capsule    Sig: Take 1 capsule (20 mg total) by mouth daily.    Dispense:  30 capsule    Refill:  1  . Zoster Vaccine Adjuvanted [facility]) injection    Sig: Inject 0.5 mLs into the muscle once for 1 dose. Repeat in 2-6 months.    Dispense:  0.5 mL    Refill:  1  . pravastatin (PRAVACHOL) 40 MG tablet    Sig: Take 1 tablet (40 mg total) by mouth daily.    Dispense:  90 tablet    Refill:  1    DX Code Needed  .  . lisinopril-hydrochlorothiazide (ZESTORETIC) 10-12.5 MG tablet    Sig: Take 1 tablet by mouth daily.    Dispense:  90 tablet    Refill:  1   Patient Instructions    Start omeprazole for possible heartburn, that may also cause cough. Avoid foods below that make that worse. Follow up in 2 weeks if cough not resolving - sooner if worse.   I will refer you to gastroenterologist to discuss issue with swallowing.  Return to the clinic or go to the nearest emergency room if any of your symptoms worsen or new symptoms occur.  I sent shingles vaccine to your pharmacy.   I recommend scheduling appointment with eye doctor and dentist.   Cough, Adult Coughing is a reflex that clears your throat and your airways (respiratory system). Coughing helps to heal and protect your lungs. It is normal to cough occasionally, but a cough that happens with other symptoms or lasts  a long time may be a sign of a condition that needs treatment. An acute cough may only last 2-3 weeks, while a chronic cough may last 8 or more weeks. Coughing is commonly caused by:  Infection of the respiratory systemby viruses or bacteria.  Breathing in substances that irritate your lungs.  Allergies.  Asthma.  Mucus that runs down the back of your throat (postnasal drip).   Smoking.  Acid backing up from the stomach into the esophagus (gastroesophageal reflux).  Certain medicines.  Chronic lung problems.  Other medical conditions such as heart failure or a blood clot in the lung (pulmonary embolism). Follow these instructions at home: Medicines  Take over-the-counter and prescription medicines only as told by your health care provider.  Talk with your health care provider before you take a cough suppressant medicine. Lifestyle   Avoid cigarette smoke. Do not use any products that contain nicotine or tobacco, such as cigarettes, e-cigarettes, and chewing tobacco. If you need help quitting, ask your health care provider.  Drink enough fluid to keep your urine pale yellow.  Avoid caffeine.  Do not drink alcohol if your health care provider tells you not to drink. General instructions   Pay close attention to changes in your cough. Tell your health care provider about them.  Always cover your mouth when you cough.  Avoid things that make you cough, such as perfume, candles, cleaning products, or campfire or tobacco smoke.  If the air is dry, use a cool mist vaporizer or humidifier in your bedroom or your home to help loosen secretions.  If your cough is worse at night, try to sleep in a semi-upright position.  Rest as needed.  Keep all follow-up visits as told by your health care provider. This is important. Contact a health care provider if you:  Have new symptoms.  Cough up pus.  Have a cough that does not get better after 2-3 weeks or gets worse.  Cannot control your cough with cough suppressant medicines and you are losing sleep.  Have pain that gets worse or pain that is not helped with medicine.  Have a fever.  Have unexplained weight loss.  Have night sweats. Get help right away if:  You cough up blood.  You have difficulty breathing.  Your heartbeat is very fast. These symptoms may represent a serious problem that is  an emergency. Do not wait to see if the symptoms will go away. Get medical help right away. Call your local emergency services (911 in the [place] Do not drive yourself to the hospital. Summary  Coughing is a reflex that clears your throat and your airways. It is normal to cough occasionally, but a cough that happens with other symptoms or lasts a long time may be a sign of a condition that needs treatment.  Take over-the-counter and prescription medicines only as told by your health care provider.  Always cover your mouth when you cough.  Contact a health care provider if you have new symptoms or a cough that does not get better after 2-3 weeks or gets worse. This information is not intended to replace advice given to you by your health care provider. Make sure you discuss any questions you have with your health care provider. Document Released: 09/13/2010 Document Revised: 04/05/2018 Document Reviewed: 04/05/2018 Elsevier Patient Education  2020 [employer]. Keeping you healthy  Get these tests  Blood pressure- Have your blood pressure checked once a year by your healthcare provider.  Normal blood  pressure is 120/80  Weight- Have your body mass index (BMI) calculated to screen for obesity.  BMI is a measure of body fat based on height and weight. You can also calculate your own BMI at [email or web address].[place].[email or web address]/.  Cholesterol- Have your cholesterol checked every year.  Diabetes- Have your blood sugar checked regularly if you have high blood pressure, high cholesterol, have a family history of diabetes or if you are overweight.  Screening for Colon Cancer- Colonoscopy starting at age 60.  Screening may begin sooner depending on your family history and other health conditions. Follow up colonoscopy as directed by your Gastroenterologist.  Screening for Prostate Cancer- Both blood work (PSA) and a rectal exam help screen for Prostate Cancer.  Screening begins at age 60 with African-American  men and at age 60 with Caucasian men.  Screening may begin sooner depending on your family history.  Take these medicines  Aspirin- One aspirin daily can help prevent Heart disease and Stroke.  Flu shot- Every fall.  Tetanus- Every 10 years.  Zostavax- Once after the age of 60 to prevent Shingles.  Pneumonia shot- Once after the age of 60; if you are younger than 2665, ask your healthcare provider if you need a Pneumonia shot.  Take these steps  Don't smoke- If you do smoke, talk to your doctor about quitting.  For tips on how to quit, go to www.smokefree.gov or call 1-800-QUIT-NOW.  Be physically active- Exercise 5 days a week for at least 30 minutes.  If you are not already physically active start slow and gradually work up to 30 minutes of moderate physical activity.  Examples of moderate activity include walking briskly, mowing the yard, dancing, swimming, bicycling, etc.  Eat a healthy diet- Eat a variety of healthy food such as fruits, vegetables, low fat milk, low fat cheese, yogurt, lean meant, poultry, fish, beans, tofu, etc. For more information go to www.thenutritionsource.org  Drink alcohol in moderation- Limit alcohol intake to less than two drinks a day. Never drink and drive.  Dentist- Brush and floss twice daily; visit your dentist twice a year.  Depression- Your emotional health is as important as your physical health. If you're feeling down, or losing interest in things you would normally enjoy please talk to your healthcare provider.  Eye exam- Visit your eye doctor every year.  Safe sex- If you may be exposed to a sexually transmitted infection, use a condom.  Seat belts- Seat belts can save your life; always wear one.  Smoke/Carbon Monoxide detectors- These detectors need to be installed on the appropriate level of your home.  Replace batteries at least once a year.  Skin cancer- When out in the sun, cover up and use sunscreen 15 SPF or higher.  Violence- If  anyone is threatening you, please tell your healthcare provider.  Living Will/ Health care power of attorney- Speak with your healthcare provider and family.  Food Choices for Gastroesophageal Reflux Disease, Adult When you have gastroesophageal reflux disease (GERD), the foods you eat and your eating habits are very important. Choosing the right foods can help ease your discomfort. Think about working with a nutrition specialist (dietitian) to help you make good choices. What are tips for following this plan?  Meals  Choose healthy foods that are low in fat, such as fruits, vegetables, whole grains, low-fat dairy products, and lean meat, fish, and poultry.  Eat small meals often instead of 3 large meals a day. Eat your meals slowly, and in a place where  you are relaxed. Avoid bending over or lying down until 2-3 hours after eating.  Avoid eating meals 2-3 hours before bed.  Avoid drinking a lot of liquid with meals.  Cook foods using methods other than frying. Bake, grill, or broil food instead.  Avoid or limit: ? Chocolate. ? Peppermint or spearmint. ? Alcohol. ? Pepper. ? Black and decaffeinated coffee. ? Black and decaffeinated tea. ? Bubbly (carbonated) soft drinks. ? Caffeinated energy drinks and soft drinks.  Limit high-fat foods such as: ? Fatty meat or fried foods. ? Whole milk, cream, butter, or ice cream. ? Nuts and nut butters. ? Pastries, donuts, and sweets made with butter or shortening.  Avoid foods that cause symptoms. These foods may be different for everyone. Common foods that cause symptoms include: ? Tomatoes. ? Oranges, lemons, and limes. ? Peppers. ? Spicy food. ? Onions and garlic. ? Vinegar. Lifestyle  Maintain a healthy weight. Ask your doctor what weight is healthy for you. If you need to lose weight, work with your doctor to do so safely.  Exercise for at least 30 minutes for 5 or more days each week, or as told by your doctor.  Wear  loose-fitting clothes.  Do not smoke. If you need help quitting, ask your doctor.  Sleep with the head of your bed higher than your feet. Use a wedge under the mattress or blocks under the bed frame to raise the head of the bed. Summary  When you have gastroesophageal reflux disease (GERD), food and lifestyle choices are very important in easing your symptoms.  Eat small meals often instead of 3 large meals a day. Eat your meals slowly, and in a place where you are relaxed.  Limit high-fat foods such as fatty meat or fried foods.  Avoid bending over or lying down until 2-3 hours after eating.  Avoid peppermint and spearmint, caffeine, alcohol, and chocolate. This information is not intended to replace advice given to you by your health care provider. Make sure you discuss any questions you have with your health care provider. Document Released: 09/16/2011 Document Revised: 07/08/2018 Document Reviewed: 04/22/2016 Elsevier Patient Education  [employer]2020 Else[employer] Inc.     If you have lab work done today you will be contacted with your lab results within the next 2 weeks.  If you have not heard from us then please contact us. The fastest way to get your results is to register for My Chart.   IF you received an x-ray today, you will receive an invoice from [facility]. Please contact [facility] at [phone] with questions or concerns regarding your invoice.   IF you received labwork today, you will receive an invoice from [facility]. Please contact [facility] at [phone] with questions or concerns regarding your invoice.   Our billing staff will not be able to assist you with questions regarding bills from these companies.  You will be contacted with the lab results as soon as they are available. The fastest way to get your results is to activate your My Chart account. Instructions are located on the last page of this paperwork. If you have not heard from us regarding the  results in 2 weeks, please contact this office.       Signed,   Meredith StaggersJeffrey Elora Wolter, MD [facility].  11/17/18 8:42 AM

## 2018-11-17 NOTE — Patient Instructions (Addendum)
Start omeprazole for possible heartburn, that may also cause cough. Avoid foods below that make that worse. Follow up in 2 weeks if cough not resolving - sooner if worse.   I will refer you to gastroenterologist to discuss issue with swallowing.  Return to the clinic or go to the nearest emergency room if any of your symptoms worsen or new symptoms occur.  I sent shingles vaccine to your pharmacy.   I recommend scheduling appointment with eye doctor and dentist.   Cough, Adult Coughing is a reflex that clears your throat and your airways (respiratory system). Coughing helps to heal and protect your lungs. It is normal to cough occasionally, but a cough that happens with other symptoms or lasts a long time may be a sign of a condition that needs treatment. An acute cough may only last 2-3 weeks, while a chronic cough may last 8 or more weeks. Coughing is commonly caused by:  Infection of the respiratory systemby viruses or bacteria.  Breathing in substances that irritate your lungs.  Allergies.  Asthma.  Mucus that runs down the back of your throat (postnasal drip).  Smoking.  Acid backing up from the stomach into the esophagus (gastroesophageal reflux).  Certain medicines.  Chronic lung problems.  Other medical conditions such as heart failure or a blood clot in the lung (pulmonary embolism). Follow these instructions at home: Medicines  Take over-the-counter and prescription medicines only as told by your health care provider.  Talk with your health care provider before you take a cough suppressant medicine. Lifestyle   Avoid cigarette smoke. Do not use any products that contain nicotine or tobacco, such as cigarettes, e-cigarettes, and chewing tobacco. If you need help quitting, ask your health care provider.  Drink enough fluid to keep your urine pale yellow.  Avoid caffeine.  Do not drink alcohol if your health care provider tells you not to drink. General  instructions   Pay close attention to changes in your cough. Tell your health care provider about them.  Always cover your mouth when you cough.  Avoid things that make you cough, such as perfume, candles, cleaning products, or campfire or tobacco smoke.  If the air is dry, use a cool mist vaporizer or humidifier in your bedroom or your home to help loosen secretions.  If your cough is worse at night, try to sleep in a semi-upright position.  Rest as needed.  Keep all follow-up visits as told by your health care provider. This is important. Contact a health care provider if you:  Have new symptoms.  Cough up pus.  Have a cough that does not get better after 2-3 weeks or gets worse.  Cannot control your cough with cough suppressant medicines and you are losing sleep.  Have pain that gets worse or pain that is not helped with medicine.  Have a fever.  Have unexplained weight loss.  Have night sweats. Get help right away if:  You cough up blood.  You have difficulty breathing.  Your heartbeat is very fast. These symptoms may represent a serious problem that is an emergency. Do not wait to see if the symptoms will go away. Get medical help right away. Call your local emergency services (911 in the [place] Do not drive yourself to the hospital. Summary  Coughing is a reflex that clears your throat and your airways. It is normal to cough occasionally, but a cough that happens with other symptoms or lasts a long time may be a  sign of a condition that needs treatment.  Take over-the-counter and prescription medicines only as told by your health care provider.  Always cover your mouth when you cough.  Contact a health care provider if you have new symptoms or a cough that does not get better after 2-3 weeks or gets worse. This information is not intended to replace advice given to you by your health care provider. Make sure you discuss any questions you have with your health  care provider. Document Released: 09/13/2010 Document Revised: 04/05/2018 Document Reviewed: 04/05/2018 Elsevier Patient Education  2020 [employer] Keeping you healthy  Get these tests  Blood pressure- Have your blood pressure checked once a year by your healthcare provider.  Normal blood pressure is 120/80  Weight- Have your body mass index (BMI) calculated to screen for obesity.  BMI is a measure of body fat based on height and weight. You can also calculate your own BMI at [email or web address]/.  Cholesterol- Have your cholesterol checked every year.  Diabetes- Have your blood sugar checked regularly if you have high blood pressure, high cholesterol, have a family history of diabetes or if you are overweight.  Screening for Colon Cancer- Colonoscopy starting at age 60.  Screening may begin sooner depending on your family history and other health conditions. Follow up colonoscopy as directed by your Gastroenterologist.  Screening for Prostate Cancer- Both blood work (PSA) and a rectal exam help screen for Prostate Cancer.  Screening begins at age 60 with African-American men and at age 60 with Caucasian men.  Screening may begin sooner depending on your family history.  Take these medicines  Aspirin- One aspirin daily can help prevent Heart disease and Stroke.  Flu shot- Every fall.  Tetanus- Every 10 years.  Zostavax- Once after the age of 60 to prevent Shingles.  Pneumonia shot- Once after the age of 60; if you are younger than 2765, ask your healthcare provider if you need a Pneumonia shot.  Take these steps  Don't smoke- If you do smoke, talk to your doctor about quitting.  For tips on how to quit, go to www.smokefree.gov or call 1-800-QUIT-NOW.  Be physically active- Exercise 5 days a week for at least 30 minutes.  If you are not already physically active start slow and gradually work up to 30 minutes of moderate physical activity.  Examples of moderate activity include  walking briskly, mowing the yard, dancing, swimming, bicycling, etc.  Eat a healthy diet- Eat a variety of healthy food such as fruits, vegetables, low fat milk, low fat cheese, yogurt, lean meant, poultry, fish, beans, tofu, etc. For more information go to www.thenutritionsource.org  Drink alcohol in moderation- Limit alcohol intake to less than two drinks a day. Never drink and drive.  Dentist- Brush and floss twice daily; visit your dentist twice a year.  Depression- Your emotional health is as important as your physical health. If you're feeling down, or losing interest in things you would normally enjoy please talk to your healthcare provider.  Eye exam- Visit your eye doctor every year.  Safe sex- If you may be exposed to a sexually transmitted infection, use a condom.  Seat belts- Seat belts can save your life; always wear one.  Smoke/Carbon Monoxide detectors- These detectors need to be installed on the appropriate level of your home.  Replace batteries at least once a year.  Skin cancer- When out in the sun, cover up and use sunscreen 15 SPF or higher.  Violence- If anyone is threatening  you, please tell your healthcare provider.  Living Will/ Health care power of attorney- Speak with your healthcare provider and family.  Food Choices for Gastroesophageal Reflux Disease, Adult When you have gastroesophageal reflux disease (GERD), the foods you eat and your eating habits are very important. Choosing the right foods can help ease your discomfort. Think about working with a nutrition specialist (dietitian) to help you make good choices. What are tips for following this plan?  Meals  Choose healthy foods that are low in fat, such as fruits, vegetables, whole grains, low-fat dairy products, and lean meat, fish, and poultry.  Eat small meals often instead of 3 large meals a day. Eat your meals slowly, and in a place where you are relaxed. Avoid bending over or lying down until 2-3  hours after eating.  Avoid eating meals 2-3 hours before bed.  Avoid drinking a lot of liquid with meals.  Cook foods using methods other than frying. Bake, grill, or broil food instead.  Avoid or limit: ? Chocolate. ? Peppermint or spearmint. ? Alcohol. ? Pepper. ? Black and decaffeinated coffee. ? Black and decaffeinated tea. ? Bubbly (carbonated) soft drinks. ? Caffeinated energy drinks and soft drinks.  Limit high-fat foods such as: ? Fatty meat or fried foods. ? Whole milk, cream, butter, or ice cream. ? Nuts and nut butters. ? Pastries, donuts, and sweets made with butter or shortening.  Avoid foods that cause symptoms. These foods may be different for everyone. Common foods that cause symptoms include: ? Tomatoes. ? Oranges, lemons, and limes. ? Peppers. ? Spicy food. ? Onions and garlic. ? Vinegar. Lifestyle  Maintain a healthy weight. Ask your doctor what weight is healthy for you. If you need to lose weight, work with your doctor to do so safely.  Exercise for at least 30 minutes for 5 or more days each week, or as told by your doctor.  Wear loose-fitting clothes.  Do not smoke. If you need help quitting, ask your doctor.  Sleep with the head of your bed higher than your feet. Use a wedge under the mattress or blocks under the bed frame to raise the head of the bed. Summary  When you have gastroesophageal reflux disease (GERD), food and lifestyle choices are very important in easing your symptoms.  Eat small meals often instead of 3 large meals a day. Eat your meals slowly, and in a place where you are relaxed.  Limit high-fat foods such as fatty meat or fried foods.  Avoid bending over or lying down until 2-3 hours after eating.  Avoid peppermint and spearmint, caffeine, alcohol, and chocolate. This information is not intended to replace advice given to you by your health care provider. Make sure you discuss any questions you have with your health care  provider. Document Released: 09/16/2011 Document Revised: 07/08/2018 Document Reviewed: 04/22/2016 Elsevier Patient Education  [employer]2020 Elsevier Inc.     If you have lab work done today you will be contacted with your lab results within the next 2 weeks.  If you have not heard from us then please contact us. The fastest way to get your results is to register for My Chart.   IF you received an x-ray today, you will receive an invoice from [facility]. Please contact [facility] at [phone] with questions or concerns regarding your invoice.   IF you received labwork today, you will receive an invoice from [facility]. Please contact [facility] at [phone] with questions or concerns regarding your invoice.  Our billing staff will not be able to assist you with questions regarding bills from these companies.  You will be contacted with the lab results as soon as they are available. The fastest way to get your results is to activate your My Chart account. Instructions are located on the last page of this paperwork. If you have not heard from [place] regarding the results in 2 weeks, please contact this office.

## 2018-11-18 LAB — COMPREHENSIVE METABOLIC PANEL
ALT: 18 IU/L (ref 0–44)
AST: 14 IU/L (ref 0–40)
Albumin/Globulin Ratio: 1.2 (ref 1.2–2.2)
Albumin: 3.8 g/dL (ref 3.8–4.9)
Alkaline Phosphatase: 53 IU/L (ref 39–117)
BUN/Creatinine Ratio: 18 (ref 9–20)
BUN: 17 mg/dL (ref 6–24)
Bilirubin Total: 0.3 mg/dL (ref 0.0–1.2)
CO2: 23 mmol/L (ref 20–29)
Calcium: 9.3 mg/dL (ref 8.7–10.2)
Chloride: 99 mmol/L (ref 96–106)
Creatinine, Ser: 0.96 mg/dL (ref 0.76–1.27)
GFR calc Af Amer: 100 mL/min/{1.73_m2} (ref >59)
GFR calc non Af Amer: 86 mL/min/{1.73_m2} (ref >59)
Globulin, Total: 3.2 g/dL (ref 1.5–4.5)
Glucose: 95 mg/dL (ref 65–99)
Potassium: 4.4 mmol/L (ref 3.5–5.2)
Sodium: 137 mmol/L (ref 134–144)
Total Protein: 7 g/dL (ref 6.0–8.5)

## 2018-11-18 LAB — LIPID PANEL
Chol/HDL Ratio: 4.4 ratio (ref 0.0–5.0)
Cholesterol, Total: 122 mg/dL (ref 100–199)
HDL: 28 mg/dL — ABNORMAL LOW (ref >39)
LDL Calculated: 74 mg/dL (ref 0–99)
Triglycerides: 99 mg/dL (ref 0–149)
VLDL Cholesterol Cal: 20 mg/dL (ref 5–40)

## 2018-11-29 ENCOUNTER — Encounter: Payer: Self-pay | Admitting: Radiology

## 2018-12-01 ENCOUNTER — Ambulatory Visit: Payer: BC Managed Care – PPO | Admitting: Family Medicine

## 2018-12-13 ENCOUNTER — Other Ambulatory Visit: Payer: Self-pay | Admitting: Family Medicine

## 2018-12-13 DIAGNOSIS — R1310 Dysphagia, unspecified: Secondary | ICD-10-CM

## 2018-12-13 DIAGNOSIS — R059 Cough, unspecified: Secondary | ICD-10-CM

## 2018-12-13 DIAGNOSIS — R05 Cough: Secondary | ICD-10-CM

## 2018-12-19 ENCOUNTER — Other Ambulatory Visit: Payer: Self-pay | Admitting: Family Medicine

## 2018-12-19 DIAGNOSIS — R05 Cough: Secondary | ICD-10-CM

## 2018-12-19 DIAGNOSIS — R1310 Dysphagia, unspecified: Secondary | ICD-10-CM

## 2018-12-19 DIAGNOSIS — R059 Cough, unspecified: Secondary | ICD-10-CM

## 2018-12-20 ENCOUNTER — Encounter: Payer: Self-pay | Admitting: Family Medicine

## 2018-12-20 NOTE — Telephone Encounter (Signed)
Requested medication (s) are due for refill today: yes  Requested medication (s) are on the active medication list: yes  Last refill:  12/13/2018  Future visit scheduled: no  Notes to clinic: [place]   Requested Prescriptions  Pending Prescriptions Disp Refills   omeprazole (PRILOSEC) 20 MG capsule [Pharmacy Med Name: OMEPRAZOLE DR 20 MG CAPSULE] 90 capsule 1    Sig: TAKE [place]     Gastroenterology: Proton Pump Inhibitors Passed - 12/19/2018 12:02 PM      Passed - Valid encounter within last 12 months    Recent Outpatient Visits          1 month ago Annual physical exam   [facility], Ranell Patrick, MD   11 months ago Acute pain of left knee   [facility], Arlie Solomons, MD   1 year ago Annual physical exam   [facility], Ranell Patrick, MD   2 years ago Annual physical exam   [facility], Ranell Patrick, MD   3 years ago Annual physical exam   [facility] at [facility], Fenton Malling, MD
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
## 2019-04-30 ENCOUNTER — Other Ambulatory Visit: Payer: Self-pay | Admitting: Family Medicine

## 2019-04-30 DIAGNOSIS — E7800 Pure hypercholesterolemia, unspecified: Secondary | ICD-10-CM

## 2019-04-30 DIAGNOSIS — I10 Essential (primary) hypertension: Secondary | ICD-10-CM

## 2019-10-17 ENCOUNTER — Other Ambulatory Visit: Payer: Self-pay | Admitting: Family Medicine

## 2019-10-17 DIAGNOSIS — E7800 Pure hypercholesterolemia, unspecified: Secondary | ICD-10-CM

## 2019-11-10 ENCOUNTER — Other Ambulatory Visit: Payer: Self-pay | Admitting: Family Medicine

## 2019-11-10 DIAGNOSIS — I10 Essential (primary) hypertension: Secondary | ICD-10-CM

## 2019-11-10 NOTE — Telephone Encounter (Signed)
Requested medication (s) are due for refill today: yes  Requested medication (s) are on the active medication list: yes  Last refill:  08/17/2019  Future visit scheduled: no  Notes to clinic:  patient overdue for follow up    Requested Prescriptions  Pending Prescriptions Disp Refills   lisinopril-hydrochlorothiazide (ZESTORETIC) 10-12.5 MG tablet [Pharmacy Med Name: LISINOPRIL-HCTZ 10-12.5 MG TAB] 90 tablet 1    Sig: TAKE 1 TABLET BY MOUTH EVERY DAY      Cardiovascular:  ACEI + Diuretic Combos Failed - 11/10/2019  1:34 AM      Failed - Na in normal range and within 180 days    Sodium  Date Value Ref Range Status  11/17/2018 137 134 - 144 mmol/L Final          Failed - K in normal range and within 180 days    Potassium  Date Value Ref Range Status  11/17/2018 4.4 3.5 - 5.2 mmol/L Final          Failed - Cr in normal range and within 180 days    Creat  Date Value Ref Range Status  06/30/2015 0.99 0.70 - 1.33 mg/dL Final   Creatinine, Ser  Date Value Ref Range Status  11/17/2018 0.96 0.76 - 1.27 mg/dL Final          Failed - Ca in normal range and within 180 days    Calcium  Date Value Ref Range Status  11/17/2018 9.3 8.7 - 10.2 mg/dL Final          Failed - Valid encounter within last 6 months    Recent Outpatient Visits           11 months ago Annual physical exam   [facility], Asencion Partridge, MD   1 year ago Acute pain of left knee   [facility], Manus Rudd, MD   1 year ago Annual physical exam   [facility], Asencion Partridge, MD   3 years ago Annual physical exam   [facility], Asencion Partridge, MD   4 years ago Annual physical exam   [facility] at [facility], Sandria Bales, MD              Passed - Patient is not pregnant      Passed - Last BP in normal range    BP Readings from Last 1 Encounters:  12/22/17 105/76

## 2020-01-08 ENCOUNTER — Other Ambulatory Visit: Payer: Self-pay | Admitting: Family Medicine

## 2020-01-08 DIAGNOSIS — E7800 Pure hypercholesterolemia, unspecified: Secondary | ICD-10-CM

## 2020-01-27 ENCOUNTER — Other Ambulatory Visit: Payer: Self-pay | Admitting: Family Medicine

## 2020-01-27 DIAGNOSIS — E7800 Pure hypercholesterolemia, unspecified: Secondary | ICD-10-CM

## 2020-01-27 NOTE — Telephone Encounter (Signed)
Requested medication (s) are due for refill today: no  Requested medication (s) are on the active medication list: yes   Last refill:  10/17/2019  Future visit scheduled: no  Notes to clinic:  overdue for follow up appointment    Requested Prescriptions  Pending Prescriptions Disp Refills   pravastatin (PRAVACHOL) 40 MG tablet [Pharmacy Med Name: PRAVASTATIN SODIUM 40 MG TAB] 90 tablet 0    Sig: TAKE 1 TABLET BY MOUTH EVERY DAY      Cardiovascular:  Antilipid - Statins Failed - 01/27/2020  2:46 PM      Failed - Total Cholesterol in normal range and within 360 days    Cholesterol, Total  Date Value Ref Range Status  11/17/2018 122 100 - 199 mg/dL Final          Failed - LDL in normal range and within 360 days    LDL Calculated  Date Value Ref Range Status  11/17/2018 74 0 - 99 mg/dL Final          Failed - HDL in normal range and within 360 days    HDL  Date Value Ref Range Status  11/17/2018 28 (L) >39 mg/dL Final          Failed - Triglycerides in normal range and within 360 days    Triglycerides  Date Value Ref Range Status  11/17/2018 99 0 - 149 mg/dL Final          Failed - Valid encounter within last 12 months    Recent Outpatient Visits           1 year ago Annual physical exam   [facility], Asencion Partridge, MD   2 years ago Acute pain of left knee   [facility], Manus Rudd, MD   2 years ago Annual physical exam   [facility], Asencion Partridge, MD   3 years ago Annual physical exam   [facility], Asencion Partridge, MD   4 years ago Annual physical exam   Primary Care at [facility], Sandria Bales, MD              Passed - Patient is not pregnant

## 2020-01-30 NOTE — Telephone Encounter (Signed)
Pt was last seen on 11/17/2018.   Pt will need a follow up appt.   I will send in refill for 1 month as curtsy.

## 2020-01-31 ENCOUNTER — Other Ambulatory Visit: Payer: Self-pay | Admitting: Family Medicine

## 2020-01-31 DIAGNOSIS — I10 Essential (primary) hypertension: Secondary | ICD-10-CM

## 2020-01-31 NOTE — Telephone Encounter (Signed)
Patient has appointment scheduled- he will be a couple tablets short-#30 sent to pharamcy

## 2020-01-31 NOTE — Telephone Encounter (Signed)
Called pt and scheduled med refill on 02/08/20

## 2020-02-08 ENCOUNTER — Encounter: Payer: Self-pay | Admitting: Family Medicine

## 2020-02-08 ENCOUNTER — Ambulatory Visit: Payer: BC Managed Care – PPO | Admitting: Family Medicine

## 2020-02-08 ENCOUNTER — Other Ambulatory Visit: Payer: Self-pay

## 2020-02-08 VITALS — BP 110/69 | HR 105 | Temp 97.8°F | Ht 65.0 in | Wt 157.0 lb

## 2020-02-08 DIAGNOSIS — Z1211 Encounter for screening for malignant neoplasm of colon: Secondary | ICD-10-CM | POA: Diagnosis not present

## 2020-02-08 DIAGNOSIS — E7800 Pure hypercholesterolemia, unspecified: Secondary | ICD-10-CM | POA: Diagnosis not present

## 2020-02-08 DIAGNOSIS — I10 Essential (primary) hypertension: Secondary | ICD-10-CM

## 2020-02-08 LAB — LIPID PANEL
Chol/HDL Ratio: 3.6 ratio (ref 0.0–5.0)
Cholesterol, Total: 128 mg/dL (ref 100–199)
HDL: 36 mg/dL — ABNORMAL LOW (ref >39)
LDL Chol Calc (NIH): 75 mg/dL (ref 0–99)
Triglycerides: 88 mg/dL (ref 0–149)
VLDL Cholesterol Cal: 17 mg/dL (ref 5–40)

## 2020-02-08 LAB — COMPREHENSIVE METABOLIC PANEL
ALT: 15 IU/L (ref 0–44)
AST: 14 IU/L (ref 0–40)
Albumin/Globulin Ratio: 1.3 (ref 1.2–2.2)
Albumin: 3.9 g/dL (ref 3.8–4.9)
Alkaline Phosphatase: 46 IU/L (ref 44–121)
BUN/Creatinine Ratio: 21 (ref 10–24)
BUN: 20 mg/dL (ref 8–27)
Bilirubin Total: 0.3 mg/dL (ref 0.0–1.2)
CO2: 23 mmol/L (ref 20–29)
Calcium: 9 mg/dL (ref 8.6–10.2)
Chloride: 99 mmol/L (ref 96–106)
Creatinine, Ser: 0.96 mg/dL (ref 0.76–1.27)
GFR calc Af Amer: 99 mL/min/{1.73_m2} (ref >59)
GFR calc non Af Amer: 86 mL/min/{1.73_m2} (ref >59)
Globulin, Total: 3.1 g/dL (ref 1.5–4.5)
Glucose: 94 mg/dL (ref 65–99)
Potassium: 4 mmol/L (ref 3.5–5.2)
Sodium: 135 mmol/L (ref 134–144)
Total Protein: 7 g/dL (ref 6.0–8.5)

## 2020-02-08 MED ORDER — LISINOPRIL-HYDROCHLOROTHIAZIDE 10-12.5 MG PO TABS: 1.0000 | ORAL_TABLET | Freq: Every day | ORAL | 1 refills | Status: AC

## 2020-02-08 MED ORDER — PRAVASTATIN SODIUM 40 MG PO TABS: 40.0000 mg | ORAL_TABLET | Freq: Every day | ORAL | 1 refills | Status: DC

## 2020-02-08 NOTE — Patient Instructions (Addendum)
  Depending on labs today, may need to repeat labs fasting.    If you have lab work done today you will be contacted with your lab results within the next 2 weeks.  If you have not heard from [place] then please contact us. The fastest way to get your results is to register for My Chart.   IF you received an x-ray today, you will receive an invoice from [facility]. Please contact [facility] at [phone] with questions or concerns regarding your invoice.   IF you received labwork today, you will receive an invoice from [facility]. Please contact [facility] at [phone] with questions or concerns regarding your invoice.   Our billing staff will not be able to assist you with questions regarding bills from these companies.  You will be contacted with the lab results as soon as they are available. The fastest way to get your results is to activate your My Chart account. Instructions are located on the last page of this paperwork. If you have not heard from [place] regarding the results in 2 weeks, please contact this office.

## 2020-02-08 NOTE — Progress Notes (Signed)
Subjective:  Patient ID: Gregory Booker, male    DOB: [birth year]  Age: 59 y.o. MRN: [id number]  CC:  Chief Complaint  Patient presents with  . Medication Refill    Pt needs refills on Lisinopril-hydrochlorothiazide and Privastatin. PT states these medications work well with no side effects.  . Follow-up    on hypertension and hyperlipidemia. Pt reports no issues with these conditions since last OV. Pt reports no physical symptoms of these conditions.PT isn't fasting    HPI Vernal Hritz presents for   Hypertension: Lisinopril HCTZ 10/12.5 mg daily.  No new side effects.  Home readings: none.  BP Readings from Last 3 Encounters:  02/08/20 110/69  12/22/17 105/76  11/12/17 102/66   Lab Results  Component Value Date   CREATININE 0.96 11/17/2018   Hyperlipidemia: Pravastatin 40 mg daily, no new myalgias.  Not fasting today - pepsi and cake.  Lab Results  Component Value Date   CHOL 122 11/17/2018   HDL 28 (L) 11/17/2018   LDLCALC 74 11/17/2018   TRIG 99 11/17/2018   CHOLHDL 4.4 11/17/2018   Lab Results  Component Value Date   ALT 18 11/17/2018   AST 14 11/17/2018   ALKPHOS 53 11/17/2018   BILITOT 0.3 11/17/2018    GERD/heartburn Omeprazole 20 mg daily as needed - not needed recently. Watching how he is eating, and avoiding late meals.   HM: Flu vaccine: declines.  Colon cancer screening: no prior test. Screening options with colonoscopy versus Cologuard discussed. Discussed timing of repeat testing intervals if normal, as well as potential need for diagnostic Colonoscopy if positive Cologuard. Understanding expressed, and chose Cologuard.      History Patient Active Problem List   Diagnosis Date Noted  . Hyperlipidemia 06/30/2015  . HTN (hypertension) 09/29/2014   Past Medical History:  Diagnosis Date  . Hypercholesteremia   . Hyperlipidemia   . Hypertension    Past Surgical History:  Procedure Laterality Date  . HERNIA REPAIR     No Known  Allergies Prior to Admission medications   Medication Sig Start Date End Date Taking? Authorizing Provider  lisinopril-hydrochlorothiazide (ZESTORETIC) 10-12.5 MG tablet TAKE 1 TABLET BY MOUTH EVERY DAY 01/31/20  Yes Shade Flood, MD  pravastatin (PRAVACHOL) 40 MG tablet TAKE 1 TABLET BY MOUTH EVERY DAY 01/30/20  Yes Shade Flood, MD  omeprazole (PRILOSEC) 20 MG capsule TAKE 1 CAPSULE BY MOUTH EVERY DAY Patient not taking: Reported on 02/08/2020 12/22/18   Shade Flood, MD   Social History   Socioeconomic History  . Marital status: Single    Spouse name: Not on file  . Number of children: Not on file  . Years of education: Not on file  . Highest education level: Not on file  Occupational History  . Not on file  Tobacco Use  . Smoking status: Never Smoker  . Smokeless tobacco: Never Used  Substance and Sexual Activity  . Alcohol use: Not Currently  . Drug use: Never  . Sexual activity: Not on file  Other Topics Concern  . Not on file  Social History Narrative   ** Merged History Encounter **       Social Determinants of Health   Financial Resource Strain:   . Difficulty of Paying Living Expenses: Not on file  Food Insecurity:   . Worried About Programme researcher, broadcasting/film/video in the Last Year: Not on file  . Ran Out of Food in the Last Year: Not on file  Transportation Needs:   .  Lack of Transportation (Medical): Not on file  . Lack of Transportation (Non-Medical): Not on file  Physical Activity:   . Days of Exercise per Week: Not on file  . Minutes of Exercise per Session: Not on file  Stress:   . Feeling of Stress : Not on file  Social Connections:   . Frequency of Communication with Friends and Family: Not on file  . Frequency of Social Gatherings with Friends and Family: Not on file  . Attends Religious Services: Not on file  . Active Member of Clubs or Organizations: Not on file  . Attends Banker Meetings: Not on file  . Marital Status: Not on file   Intimate Partner Violence:   . Fear of Current or Ex-Partner: Not on file  . Emotionally Abused: Not on file  . Physically Abused: Not on file  . Sexually Abused: Not on file    Review of Systems  Constitutional: Negative for fatigue and unexpected weight change.  Eyes: Negative for visual disturbance.  Respiratory: Negative for cough, chest tightness and shortness of breath.   Cardiovascular: Negative for chest pain, palpitations and leg swelling.  Gastrointestinal: Negative for abdominal pain and blood in stool.  Neurological: Negative for dizziness, light-headedness and headaches.     Objective:   Vitals:   02/08/20 0823  BP: 110/69  Pulse: (!) 105  Temp: 97.8 F (36.6 C)  TempSrc: Temporal  SpO2: 97%  Weight: 157 lb (71.2 kg)  Height: 5\' 5"  (1.651 m)     Physical Exam Vitals reviewed.  Constitutional:      Appearance: He is well-developed.  HENT:     Head: Normocephalic and atraumatic.  Eyes:     Pupils: Pupils are equal, round, and reactive to light.  Neck:     Vascular: No carotid bruit or JVD.  Cardiovascular:     Rate and Rhythm: Normal rate and regular rhythm.     Heart sounds: Normal heart sounds. No murmur heard.   Pulmonary:     Effort: Pulmonary effort is normal.     Breath sounds: Normal breath sounds. No rales.  Skin:    General: Skin is warm and dry.  Neurological:     Mental Status: He is alert and oriented to person, place, and time.        Assessment & Plan:  Gregory Booker is a 59 y.o. male . Benign essential HTN - Plan: lisinopril-hydrochlorothiazide (ZESTORETIC) 10-12.5 MG tablet, Lipid panel, Comprehensive metabolic panel  -  Stable, tolerating current regimen. Medications refilled. Labs pending as above.   Pure hypercholesterolemia - Plan: pravastatin (PRAVACHOL) 40 MG tablet, Lipid panel, Comprehensive metabolic panel  -  Stable, tolerating current regimen. Medications refilled. Labs pending as above.   Special screening  for malignant neoplasms, colon - Plan: Cologuard  - as above, chose cologuard, all questions answered.   Meds ordered this encounter  Medications  . pravastatin (PRAVACHOL) 40 MG tablet    Sig: Take 1 tablet (40 mg total) by mouth daily.    Dispense:  90 tablet    Refill:  1  . lisinopril-hydrochlorothiazide (ZESTORETIC) 10-12.5 MG tablet    Sig: Take 1 tablet by mouth daily.    Dispense:  90 tablet    Refill:  1   Patient Instructions    Depending on labs today, may need to repeat labs fasting.    If you have lab work done today you will be contacted with your lab results within the next 2 weeks.  If you have not heard from [place] then please contact us. The fastest way to get your results is to register for My Chart.   IF you received an x-ray today, you will receive an invoice from [facility]. Please contact [facility] at [phone] with questions or concerns regarding your invoice.   IF you received labwork today, you will receive an invoice from [place]. Please contact [facility] at [phone] with questions or concerns regarding your invoice.   Our billing staff will not be able to assist you with questions regarding bills from these companies.  You will be contacted with the lab results as soon as they are available. The fastest way to get your results is to activate your My Chart account. Instructions are located on the last page of this paperwork. If you have not heard from [place] regarding the results in 2 weeks, please contact this office.         Signed, Merri Ray, MD [facility]

## 2020-02-14 ENCOUNTER — Encounter: Payer: Self-pay | Admitting: Radiology

## 2020-08-08 ENCOUNTER — Encounter: Payer: Self-pay | Admitting: Family Medicine

## 2020-08-08 NOTE — Progress Notes (Deleted)
   Subjective:  Patient ID: Gregory Booker, male    DOB: 1960-09-30  Age: 60 y.o. MRN: [id number]  CC: No chief complaint on file.   HPI Gregory Booker presents for    Annual physical exam  Hypertension: Lisinopril HCTZ 10/12.5 mg daily Home readings: BP Readings from Last 3 Encounters:  02/08/20 110/69  12/22/17 105/76  11/12/17 102/66   Lab Results  Component Value Date   CREATININE 0.96 02/08/2020   Hyperlipidemia: Pravastatin 40 mg daily Lab Results  Component Value Date   CHOL 128 02/08/2020   HDL 36 (L) 02/08/2020   LDLCALC 75 02/08/2020   TRIG 88 02/08/2020   CHOLHDL 3.6 02/08/2020   Lab Results  Component Value Date   ALT 15 02/08/2020   AST 14 02/08/2020   ALKPHOS 46 02/08/2020   BILITOT 0.3 02/08/2020    Cancer screening    History Patient Active Problem List   Diagnosis Date Noted  . Hyperlipidemia 06/30/2015  . HTN (hypertension) 09/29/2014   Past Medical History:  Diagnosis Date  . Hypercholesteremia   . Hyperlipidemia   . Hypertension    Past Surgical History:  Procedure Laterality Date  . HERNIA REPAIR     No Known Allergies Prior to Admission medications   Medication Sig Start Date End Date Taking? Authorizing Provider  lisinopril-hydrochlorothiazide (ZESTORETIC) 10-12.5 MG tablet Take 1 tablet by mouth daily. 02/08/20   Shade Flood, MD  omeprazole (PRILOSEC) 20 MG capsule TAKE 1 CAPSULE BY MOUTH EVERY DAY Patient not taking: Reported on 02/08/2020 12/22/18   Shade Flood, MD  pravastatin (PRAVACHOL) 40 MG tablet Take 1 tablet (40 mg total) by mouth daily. 02/08/20   Shade Flood, MD   Social History   Socioeconomic History  . Marital status: Single    Spouse name: Not on file  . Number of children: Not on file  . Years of education: Not on file  . Highest education level: Not on file  Occupational History  . Not on file  Tobacco Use  . Smoking status: Never Smoker  . Smokeless tobacco: Never Used   Substance and Sexual Activity  . Alcohol use: Not Currently  . Drug use: Never  . Sexual activity: Not on file  Other Topics Concern  . Not on file  Social History Narrative   ** Merged History Encounter **       Social Determinants of Health   Financial Resource Strain: Not on file  Food Insecurity: Not on file  Transportation Needs: Not on file  Physical Activity: Not on file  Stress: Not on file  Social Connections: Not on file  Intimate Partner Violence: Not on file    Review of Systems   Objective:  There were no vitals filed for this visit.   Physical Exam     Assessment & Plan:  Gregory Booker is a 60 y.o. male . No diagnosis found.   No orders of the defined types were placed in this encounter.  There are no Patient Instructions on file for this visit.    Signed, Meredith Staggers, MD [facility]

## 2020-08-23 ENCOUNTER — Other Ambulatory Visit: Payer: Self-pay

## 2020-08-23 ENCOUNTER — Telehealth: Payer: Self-pay | Admitting: Family Medicine

## 2020-08-23 DIAGNOSIS — E7800 Pure hypercholesterolemia, unspecified: Secondary | ICD-10-CM

## 2020-08-23 MED ORDER — PRAVASTATIN SODIUM 40 MG PO TABS: 40.0000 mg | ORAL_TABLET | Freq: Every day | ORAL | 0 refills | Status: AC

## 2020-08-23 NOTE — Telephone Encounter (Signed)
..  Medication Refills  Last OV:  Medication:  Patient does not know name of medication - says it is for his cholesterol  Pharmacy:  [facility] - [place], [place]  Let patient know to contact pharmacy at the end of the day to make sure medication is ready.   Please notify patient to allow 48-72 hours to process.  Encourage patient to contact the pharmacy for refills or they can request refills through [facility]  Clinical Fills out below:   Last refill:  QTY:  Refill Date:    Other Comments:   Okay for refill?  Please advise.

## 2020-08-23 NOTE — Telephone Encounter (Signed)
Medication sent to patient's pharmacy.

## 2023-10-25 NOTE — Progress Notes (Deleted)
 Cardiology Office Note:   Date:  10/25/2023  ID:  Gregory Booker, DOB 1960-12-13, MRN [id number] PCP:  [name], MD  [facility] Providers Cardiologist:  [name], MD Referring MD: [name], MD  Chief Complaint/Reason for Referral: Presyncope ASSESSMENT:    1. Postural dizziness with presyncope   2. Primary hypertension   3. Dyslipidemia     PLAN:   In order of problems listed above: Presyncope: Obtain echocardiogram and monitor to evaluate further.  Will check CMP, CBC, and reflex TSH. Hypertension: Continue lisinopril /hydrochlorothiazide  10 x 12.5 mg daily*** Dyslipidemia: Followed by PCP; continue pravastatin  40 mg.        {Are you ordering a CV Procedure (e.g. stress test, cath, DCCV, TEE, etc)?   Press F2        :[id number]}   Dispo:  No follow-ups on file.       Labs/tests ordered: No orders of the defined types were placed in this encounter.   Current medicines are reviewed at length with the patient today.  The patient {ACTIONS; HAS/DOES NOT HAVE:19233} concerns regarding medicines.  I spent *** minutes reviewing all clinical data during and prior to this visit including all relevant imaging studies, laboratories, clinical information from other health systems and prior notes from both Cardiology and other specialties, interviewing the patient, conducting a complete physical examination, and coordinating care in order to formulate a comprehensive and personalized evaluation and treatment plan.   History of Present Illness:    FOCUSED PROBLEM LIST:   Hypertension Dyslipidemia BMI 24 November 2023:  Patient consents to use of AI scribe. Patient is a 63 year old male with the above listed medical problems referred by his PCP for presyncope.  Patient had been seen by his PCP in April with complaints of dizziness and presyncope with exertion.     Current Medications: No outpatient medications have been marked as taking for the 11/02/23 encounter  (Appointment) with [name], MD.     Review of Systems:   Please see the history of present illness.    All other systems reviewed and are negative.     EKGs/Labs/Other Test Reviewed:   EKG: 2015 sinus rhythm with LVH  EKG Interpretation Date/Time:    Ventricular Rate:    PR Interval:    QRS Duration:    QT Interval:    QTC Calculation:   R Axis:      Text Interpretation:           Risk Assessment/Calculations:   {Does this patient have ATRIAL FIBRILLATION?:[id number]}      Physical Exam:   VS:  There were no vitals taken for this visit.   No BP recorded.  {Refresh Note OR Click here to enter BP  :1}***   Wt Readings from Last 3 Encounters:  02/08/20 157 lb (71.2 kg)  12/21/17 140 lb (63.5 kg)  11/12/17 158 lb 12.8 oz (72 kg)      GENERAL:  No apparent distress, AOx3 HEENT:  No carotid bruits, +2 carotid impulses, no scleral icterus CAR: RRR Irregular RR*** no murmurs***, gallops, rubs, or thrills RES:  Clear to auscultation bilaterally ABD:  Soft, nontender, nondistended, positive bowel sounds x 4 VASC:  +2 radial pulses, +2 carotid pulses NEURO:  CN 2-12 grossly intact; motor and sensory grossly intact PSYCH:  No active depression or anxiety EXT:  No edema, ecchymosis, or cyanosis  Signed, [name], MD  10/25/2023 11:25 AM    [facility] [address]  [address], [place], [place]  [address] Phone: [phone]; Fax: [phone]   Note:  This document was prepared using Dragon voice recognition software and may include unintentional dictation errors.

## 2023-11-02 ENCOUNTER — Ambulatory Visit: Payer: Self-pay | Attending: Internal Medicine | Admitting: Internal Medicine

## 2023-11-02 DIAGNOSIS — E785 Hyperlipidemia, unspecified: Secondary | ICD-10-CM

## 2023-11-02 DIAGNOSIS — R42 Dizziness and giddiness: Secondary | ICD-10-CM

## 2023-11-02 DIAGNOSIS — I10 Essential (primary) hypertension: Secondary | ICD-10-CM
